# Patient Record
Sex: Female | Born: 1945 | Race: Black or African American | Hispanic: No | Smoking: Never smoker
Health system: Southern US, Community
[De-identification: ages and names within clinical notes are randomized; demographics above are authoritative.]

## PROBLEM LIST (undated history)

## (undated) DIAGNOSIS — E119 Type 2 diabetes mellitus without complications: Secondary | ICD-10-CM

## (undated) DIAGNOSIS — E785 Hyperlipidemia, unspecified: Secondary | ICD-10-CM

## (undated) DIAGNOSIS — F0390 Unspecified dementia without behavioral disturbance: Secondary | ICD-10-CM

## (undated) DIAGNOSIS — N1831 Chronic kidney disease, stage 3a: Secondary | ICD-10-CM

## (undated) DIAGNOSIS — N289 Disorder of kidney and ureter, unspecified: Secondary | ICD-10-CM

## (undated) DIAGNOSIS — I10 Essential (primary) hypertension: Secondary | ICD-10-CM

## (undated) HISTORY — PX: ABDOMINAL HYSTERECTOMY: SHX81

## (undated) HISTORY — PX: CHOLECYSTECTOMY: SHX55

---

## 2006-07-02 ENCOUNTER — Encounter: Payer: Self-pay | Admitting: Orthopedic Surgery

## 2006-07-17 ENCOUNTER — Encounter: Payer: Self-pay | Admitting: Orthopedic Surgery

## 2006-08-17 ENCOUNTER — Encounter: Payer: Self-pay | Admitting: Orthopedic Surgery

## 2013-10-24 ENCOUNTER — Ambulatory Visit: Payer: Self-pay | Admitting: Internal Medicine

## 2016-12-06 ENCOUNTER — Encounter (HOSPITAL_COMMUNITY): Payer: Self-pay

## 2016-12-06 ENCOUNTER — Emergency Department (HOSPITAL_COMMUNITY)
Admission: EM | Admit: 2016-12-06 | Discharge: 2016-12-06 | Disposition: A | Discharge status: Home / Self Care | Payer: Medicare Other | Attending: Emergency Medicine | Admitting: Emergency Medicine

## 2016-12-06 ENCOUNTER — Emergency Department (HOSPITAL_COMMUNITY): Payer: Medicare Other

## 2016-12-06 DIAGNOSIS — R42 Dizziness and giddiness: Secondary | ICD-10-CM | POA: Diagnosis not present

## 2016-12-06 DIAGNOSIS — E119 Type 2 diabetes mellitus without complications: Secondary | ICD-10-CM | POA: Insufficient documentation

## 2016-12-06 DIAGNOSIS — Z79899 Other long term (current) drug therapy: Secondary | ICD-10-CM | POA: Insufficient documentation

## 2016-12-06 DIAGNOSIS — Z7982 Long term (current) use of aspirin: Secondary | ICD-10-CM | POA: Diagnosis not present

## 2016-12-06 DIAGNOSIS — I10 Essential (primary) hypertension: Secondary | ICD-10-CM | POA: Diagnosis not present

## 2016-12-06 DIAGNOSIS — M25561 Pain in right knee: Secondary | ICD-10-CM | POA: Insufficient documentation

## 2016-12-06 DIAGNOSIS — Z7984 Long term (current) use of oral hypoglycemic drugs: Secondary | ICD-10-CM | POA: Diagnosis not present

## 2016-12-06 HISTORY — DX: Disorder of kidney and ureter, unspecified: N28.9

## 2016-12-06 HISTORY — DX: Type 2 diabetes mellitus without complications: E11.9

## 2016-12-06 HISTORY — DX: Essential (primary) hypertension: I10

## 2016-12-06 NOTE — ED Triage Notes (Signed)
Pt reports right calf pain that is relieved with rest and worse when walking. Pt denies recent travel or injury. Pain started 3-7 days ago. No history of blood clots.

## 2016-12-06 NOTE — ED Provider Notes (Signed)
[facility]-EM[facility]Y DEPT Provider Note   CSN: [id number] Arrival date & time: 12/06/16  2002  By signing my name below, I, Diona BrownerJennifer Gorman, attest that this documentation has been prepared under the direction and in the presence of Vanetta MuldersZackowski, Elfreda Blanchet, MD. Electronically Signed: Diona BrownerJennifer Gorman, ED Scribe. 12/06/16. 8:34 PM.  History   Chief Complaint Chief Complaint  Patient presents with  . Leg Pain    HPI Destiny Vazquez is a 71 y.o. female with a PMHx of DM and HTN, who presents to the Emergency Department complaining of mild right posterior knee pain that started 2-3 days ago. Pt notes pain is exacerbated when walking and alleviated when sitting or relaxing. It doesn't hurt to bend her knee, but she can feel discomfort. She also notes bilateral ankle edema and dizziness that occurred this morning, that lasted for ~ 15 minutes. 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 No recent injuries.   The history is provided by the patient. No language interpreter was used.  Leg Pain   This is a new problem. The current episode started more than 2 days ago. The problem has been gradually worsening. The pain is present in the right knee. The pain is mild. There has been no history of extremity trauma.    Past Medical History:  Diagnosis Date  . Diabetes mellitus without complication (HCC)   . Hypertension   . Renal disorder     There are no active problems to display for this patient.   Past Surgical History:  Procedure Laterality Date  . ABDOMINAL HYSTERECTOMY    . CHOLECYSTECTOMY      OB History    No data available       Home Medications    Prior to Admission medications   Medication Sig Start Date End Date Taking? Authorizing Provider  amLODipine (NORVASC) 5 MG tablet Take 5 mg by mouth daily.   Yes [provider]  aspirin EC 81 MG tablet Take 81 mg by mouth daily.   Yes [provider]  gabapentin (NEURONTIN) 300 MG capsule Take 600 mg by mouth 3  (three) times daily.   Yes [provider]  glipiZIDE (GLUCOTROL) 10 MG tablet Take 10 mg by mouth 2 (two) times daily.   Yes [provider]  lisinopril (PRINIVIL,ZESTRIL) 40 MG tablet Take 40 mg by mouth daily.   Yes [provider]  metFORMIN (GLUCOPHAGE) 1000 MG tablet Take 1,000 mg by mouth 2 (two) times daily.   Yes [provider]  omeprazole (PRILOSEC) 40 MG capsule Take 40 mg by mouth daily.   Yes [provider]  spironolactone (ALDACTONE) 25 MG tablet Take 25 mg by mouth 2 (two) times daily.   Yes [provider]  traMADol (ULTRAM) 50 MG tablet Take 50 mg by mouth every 6 (six) hours as needed for moderate pain or severe pain.   Yes [provider]  Vitamin D, Ergocalciferol, (DRISDOL) 50000 units CAPS capsule Take 50,000 Units by mouth every 30 (thirty) days.    Yes [provider]    Family History No family history on file.  Social History Social History  Substance Use Topics  . Smoking status: Never Smoker  . Smokeless tobacco: Never Used  . Alcohol use No     Allergies   Other   Review of Systems Review of Systems  Constitutional: Negative for fever.  HENT: Negative for rhinorrhea.   Eyes: Negative for visual disturbance.  Respiratory: Negative for shortness of breath.  Cardiovascular: Negative for chest pain.  Gastrointestinal: Negative for abdominal pain, diarrhea, nausea and vomiting.  Genitourinary: Negative for dysuria and hematuria.  Musculoskeletal: Positive for joint swelling. Negative for back pain.  Skin: Negative for rash.  Neurological: Positive for dizziness. Negative for headaches.  Hematological: Does not bruise/bleed easily.  Psychiatric/Behavioral: Negative for confusion.     Physical Exam Updated Vital Signs BP (!) 191/76 (BP Location: Right Arm)   Pulse 69   Temp 98.4 F (36.9 C) (Oral)   Resp 15   Ht 1.575 m (5\' 2" )   Wt 99.8 kg (220 lb)   SpO2 98%   BMI  40.24 kg/m   Physical Exam  Constitutional: She is oriented to person, place, and time. She appears well-developed and well-nourished.  HENT:  Head: Normocephalic and atraumatic.  Mouth/Throat: Oropharynx is clear and moist.  Eyes: Conjunctivae and EOM are normal. Pupils are equal, round, and reactive to light. Right eye exhibits no discharge. Left eye exhibits no discharge. No scleral icterus.  Neck: Normal range of motion.  Cardiovascular: Normal rate, regular rhythm, normal heart sounds and intact distal pulses.  Exam reveals no gallop and no friction rub.   No murmur heard. Pulmonary/Chest: Effort normal and breath sounds normal. No respiratory distress. She has no wheezes.  Abdominal: Bowel sounds are normal. She exhibits no distension. There is no tenderness.  Musculoskeletal: Normal range of motion.  Trace edema to bilateral legs, left greater than right. Discomfort behind right knee. Knee cap in proper place.  No knee effusion.  Pain to palpation posterior part of the knee. No pain with ROM.  Neurological: She is alert and oriented to person, place, and time. No cranial nerve deficit or sensory deficit. She exhibits normal muscle tone. Coordination normal.  Psychiatric: She has a normal mood and affect.  Nursing note and vitals reviewed.    ED Treatments / Results  DIAGNOSTIC STUDIES: Oxygen Saturation is 98% on RA, normal by my interpretation.   COORDINATION OF CARE: 8:34 PM-Discussed next steps with pt. Pt verbalized understanding and is agreeable with the plan.    Labs (all labs ordered are listed, but only abnormal results are displayed) No results found for this or any previous visit. Dg Knee Complete 4 Views Right  Result Date: 12/06/2016 CLINICAL DATA:  Patient states posterior right knee pain x 3-4 days. States no injury. Pain worst when bending. Her ankles are very swollen. EXAM: RIGHT KNEE - COMPLETE 4+ VIEW COMPARISON:  None. FINDINGS: No fracture of the  proximal tibia or distal femur. Spurring of the patella. No joint effusion. IMPRESSION: No acute osseous abnormality. Electronically Signed   By: Genevive Bi M.D.   On: 12/06/2016 21:28    Procedures Procedures (including critical care time)  Medications Ordered in ED Medications - No data to display  Initial Impression / Assessment and Plan / ED Course  I have reviewed the triage vital signs and the nursing notes.  Pertinent labs & imaging results that were available during my care of the patient were reviewed by me and considered in my medical decision making (see chart for details).    Patient with pain in the right calf more proximal part of the calf and behind the right knee. Ultrasound not available tonight to rule out DVT. Pain is intermittent. Patient will have ultrasound arranged tomorrow as an outpatient. X-rays of the right knee without any bony abnormalities. No history of any injury. Patient without chest pain or shortness of breath. Patient nontoxic no  acute distress. Patient's primary care doctor is [facility].   Final Clinical Impressions(s) / ED Diagnoses   Final diagnoses:  Acute pain of right knee    New Prescriptions New Prescriptions   No medications on file   I personally performed the services described in this documentation, which was scribed in my presence. The recorded information has been reviewed and is accurate.       Vanetta Mulders, MD 12/06/16 [phone]

## 2016-12-06 NOTE — Discharge Instructions (Signed)
X-ray of the right knee without any significant bony abnormalities. Return as scheduled tomorrow for Doppler studies of the right leg to rule out a blood clot in the veins. If negative and follow-up with your primary care provider in [place]. Return for any new or worse symptoms.

## 2016-12-06 NOTE — ED Notes (Signed)
Pt ambulatory to waiting room. Pt verbalized understanding of discharge instructions.   

## 2016-12-07 ENCOUNTER — Ambulatory Visit (HOSPITAL_COMMUNITY)
Admission: RE | Admit: 2016-12-07 | Discharge: 2016-12-07 | Disposition: A | Discharge status: Home / Self Care | Payer: Medicare Other | Source: Ambulatory Visit | Attending: Emergency Medicine | Admitting: Emergency Medicine

## 2016-12-07 ENCOUNTER — Other Ambulatory Visit (HOSPITAL_COMMUNITY): Payer: Self-pay | Admitting: Emergency Medicine

## 2016-12-07 DIAGNOSIS — I82401 Acute embolism and thrombosis of unspecified deep veins of right lower extremity: Secondary | ICD-10-CM

## 2016-12-07 DIAGNOSIS — M79604 Pain in right leg: Secondary | ICD-10-CM | POA: Diagnosis present

## 2016-12-07 NOTE — ED Provider Notes (Signed)
Patient return as planned for outpatient ultrasound imaging, right leg to rule out DVT.  This was done and is negative.  Patient states she is not having much pain and is able to walk now.  She has no other complaints.  She plans on following up with her PCP, if not better in 1 week.   Mancel BaleWentz, Makyah Lavigne, MD 12/07/16 [phone]

## 2019-02-20 ENCOUNTER — Other Ambulatory Visit (HOSPITAL_BASED_OUTPATIENT_CLINIC_OR_DEPARTMENT_OTHER): Payer: Self-pay

## 2019-02-20 DIAGNOSIS — G4733 Obstructive sleep apnea (adult) (pediatric): Secondary | ICD-10-CM

## 2019-03-04 ENCOUNTER — Other Ambulatory Visit: Payer: Self-pay

## 2019-03-04 ENCOUNTER — Ambulatory Visit: Payer: Medicare Other | Attending: Neurology | Admitting: Neurology

## 2019-03-04 DIAGNOSIS — Z7982 Long term (current) use of aspirin: Secondary | ICD-10-CM | POA: Diagnosis not present

## 2019-03-04 DIAGNOSIS — Z7984 Long term (current) use of oral hypoglycemic drugs: Secondary | ICD-10-CM | POA: Insufficient documentation

## 2019-03-04 DIAGNOSIS — G4733 Obstructive sleep apnea (adult) (pediatric): Secondary | ICD-10-CM | POA: Insufficient documentation

## 2019-03-04 DIAGNOSIS — Z79899 Other long term (current) drug therapy: Secondary | ICD-10-CM | POA: Diagnosis not present

## 2019-03-06 NOTE — Procedures (Signed)
[place] A. Merlene Laughter, MD     [email or web address]             NOCTURNAL POLYSOMNOGRAPHY   LOCATION: [facility]  Patient Name: Destiny Vazquez, Destiny Vazquez Date: 03/04/2019 Gender: Female D.O.B: 1945/09/20 Age (years): 60 Referring Provider: Phillips Odor MD, ABSM Height (inches): 62 Interpreting Physician: Phillips Odor MD, ABSM Weight (lbs): 220 RPSGT: Rosebud Poles BMI: 40 MRN: [id number] Neck Size: 15.50 CLINICAL INFORMATION Sleep Study Type: NPSG     Indication for sleep study: N/A     Epworth Sleepiness Score: 4     SLEEP STUDY TECHNIQUE As per the AASM Manual for the Scoring of Sleep and Associated Events v2.3 (April 2016) with a hypopnea requiring 4% desaturations.  The channels recorded and monitored were frontal, central and occipital EEG, electrooculogram (EOG), submentalis EMG (chin), nasal and oral airflow, thoracic and abdominal wall motion, anterior tibialis EMG, snore microphone, electrocardiogram, and pulse oximetry.  MEDICATIONS Medications self-administered by patient taken the night of the study : N/A  Current Outpatient Medications:  .  amLODipine (NORVASC) 5 MG tablet, Take 5 mg by mouth daily., Disp: , Rfl:  .  aspirin EC 81 MG tablet, Take 81 mg by mouth daily., Disp: , Rfl:  .  gabapentin (NEURONTIN) 300 MG capsule, Take 600 mg by mouth 3 (three) times daily., Disp: , Rfl:  .  glipiZIDE (GLUCOTROL) 10 MG tablet, Take 10 mg by mouth 2 (two) times daily., Disp: , Rfl:  .  lisinopril (PRINIVIL,ZESTRIL) 40 MG tablet, Take 40 mg by mouth daily., Disp: , Rfl:  .  metFORMIN (GLUCOPHAGE) 1000 MG tablet, Take 1,000 mg by mouth 2 (two) times daily., Disp: , Rfl:  .  omeprazole (PRILOSEC) 40 MG capsule, Take 40 mg by mouth daily., Disp: , Rfl:  .  spironolactone (ALDACTONE) 25 MG tablet, Take 25 mg by mouth 2 (two) times daily., Disp: , Rfl:  .  traMADol (ULTRAM) 50 MG tablet, Take 50 mg by mouth every 6 (six) hours as needed for moderate  pain or severe pain., Disp: , Rfl:  .  Vitamin D, Ergocalciferol, (DRISDOL) 50000 units CAPS capsule, Take 50,000 Units by mouth every 30 (thirty) days. , Disp: , Rfl:      SLEEP ARCHITECTURE The study was initiated at 10:52:56 PM and ended at 5:30:13 AM.  Sleep onset time was 1.2 minutes and the sleep efficiency was 75.4%%. The total sleep time was 299.6 minutes.  Stage REM latency was N/A minutes.  The patient spent 5.5%% of the night in stage N1 sleep, 94.3%% in stage N2 sleep, 0.2%% in stage N3 and 0% in REM.  Alpha intrusion was absent.  Supine sleep was 100.00%.  RESPIRATORY PARAMETERS The overall apnea/hypopnea index (AHI) was 33.4 per hour. There were 122 total apneas, including 122 obstructive, 0 central and 0 mixed apneas. There were 45 hypopneas and 0 RERAs.  The AHI during Stage REM sleep was N/A per hour.  AHI while supine was 33.4 per hour.  The mean oxygen saturation was 98.5%. The minimum SpO2 during sleep was 91.0%.  loud snoring was noted during this study.  CARDIAC DATA The 2 lead EKG demonstrated sinus rhythm. The mean heart rate was 66.8 beats per minute. Other EKG findings include: PVCs.  LEG MOVEMENT DATA The total PLMS were 0 with a resulting PLMS index of 0.0. Associated arousal with leg movement index was 0.0.  IMPRESSIONS 1.  Moderate obstructive sleep apnea syndrome is documented with this study.  AutoPap 8-15 is recommended.  2.  Abnormal sleep architecture is also noted with absent REM sleep and markedly reduced slow-wave sleep.  [name] A [name], MD Diplomate, American Board of Sleep Medicine.  ELECTRONICALLY SIGNED ON:  03/06/2019, 5:01 PM [facility] PH: [phone]   FX: [phone] ACCREDITED BY THE AMERICAN ACADEMY OF SLEEP MEDICINE

## 2021-03-23 ENCOUNTER — Other Ambulatory Visit: Payer: Self-pay

## 2021-03-23 ENCOUNTER — Observation Stay
Admission: EM | Admit: 2021-03-23 | Discharge: 2021-03-24 | Disposition: A | Discharge status: Home / Self Care | Payer: Medicare HMO | Attending: Internal Medicine | Admitting: Internal Medicine

## 2021-03-23 ENCOUNTER — Emergency Department: Payer: Medicare HMO

## 2021-03-23 DIAGNOSIS — I129 Hypertensive chronic kidney disease with stage 1 through stage 4 chronic kidney disease, or unspecified chronic kidney disease: Secondary | ICD-10-CM | POA: Insufficient documentation

## 2021-03-23 DIAGNOSIS — E11649 Type 2 diabetes mellitus with hypoglycemia without coma: Secondary | ICD-10-CM | POA: Insufficient documentation

## 2021-03-23 DIAGNOSIS — Z20822 Contact with and (suspected) exposure to covid-19: Secondary | ICD-10-CM | POA: Diagnosis not present

## 2021-03-23 DIAGNOSIS — N390 Urinary tract infection, site not specified: Secondary | ICD-10-CM | POA: Diagnosis not present

## 2021-03-23 DIAGNOSIS — F0390 Unspecified dementia without behavioral disturbance: Secondary | ICD-10-CM | POA: Diagnosis not present

## 2021-03-23 DIAGNOSIS — W19XXXA Unspecified fall, initial encounter: Secondary | ICD-10-CM | POA: Diagnosis not present

## 2021-03-23 DIAGNOSIS — T68XXXA Hypothermia, initial encounter: Secondary | ICD-10-CM | POA: Diagnosis not present

## 2021-03-23 DIAGNOSIS — E162 Hypoglycemia, unspecified: Secondary | ICD-10-CM | POA: Diagnosis not present

## 2021-03-23 DIAGNOSIS — N182 Chronic kidney disease, stage 2 (mild): Secondary | ICD-10-CM | POA: Diagnosis not present

## 2021-03-23 DIAGNOSIS — E1122 Type 2 diabetes mellitus with diabetic chronic kidney disease: Secondary | ICD-10-CM | POA: Diagnosis not present

## 2021-03-23 DIAGNOSIS — A419 Sepsis, unspecified organism: Principal | ICD-10-CM | POA: Insufficient documentation

## 2021-03-23 LAB — COMPREHENSIVE METABOLIC PANEL
ALT: 14 U/L (ref 0–44)
ALT: 12 U/L (ref 0–44)
AST: 20 U/L (ref 15–41)
AST: 20 U/L (ref 15–41)
Albumin: 3.6 g/dL (ref 3.5–5.0)
Albumin: 3.1 g/dL — ABNORMAL LOW (ref 3.5–5.0)
Alkaline Phosphatase: 35 U/L — ABNORMAL LOW (ref 38–126)
Alkaline Phosphatase: 31 U/L — ABNORMAL LOW (ref 38–126)
Anion gap: 9 (ref 5–15)
Anion gap: 7 (ref 5–15)
BUN: 20 mg/dL (ref 8–23)
BUN: 17 mg/dL (ref 8–23)
CO2: 24 mmol/L (ref 22–32)
CO2: 24 mmol/L (ref 22–32)
Calcium: 9.3 mg/dL (ref 8.9–10.3)
Calcium: 9 mg/dL (ref 8.9–10.3)
Chloride: 104 mmol/L (ref 98–111)
Chloride: 107 mmol/L (ref 98–111)
Creatinine, Ser: 1.24 mg/dL — ABNORMAL HIGH (ref 0.44–1.00)
Creatinine, Ser: 1.06 mg/dL — ABNORMAL HIGH (ref 0.44–1.00)
GFR, Estimated: 45 mL/min — ABNORMAL LOW (ref >60)
GFR, Estimated: 54 mL/min — ABNORMAL LOW (ref >60)
Glucose, Bld: 166 mg/dL — ABNORMAL HIGH (ref 70–99)
Glucose, Bld: 139 mg/dL — ABNORMAL HIGH (ref 70–99)
Potassium: 4.2 mmol/L (ref 3.5–5.1)
Potassium: 4.3 mmol/L (ref 3.5–5.1)
Sodium: 137 mmol/L (ref 135–145)
Sodium: 138 mmol/L (ref 135–145)
Total Bilirubin: 0.8 mg/dL (ref 0.3–1.2)
Total Bilirubin: 0.6 mg/dL (ref 0.3–1.2)
Total Protein: 6.9 g/dL (ref 6.5–8.1)
Total Protein: 5.9 g/dL — ABNORMAL LOW (ref 6.5–8.1)

## 2021-03-23 LAB — GLUCOSE, CAPILLARY
Glucose-Capillary: 110 mg/dL — ABNORMAL HIGH (ref 70–99)
Glucose-Capillary: 104 mg/dL — ABNORMAL HIGH (ref 70–99)
Glucose-Capillary: 84 mg/dL (ref 70–99)

## 2021-03-23 LAB — CBC WITH DIFFERENTIAL/PLATELET
Abs Immature Granulocytes: 0.01 10*3/uL (ref 0.00–0.07)
Abs Immature Granulocytes: 0.01 10*3/uL (ref 0.00–0.07)
Basophils Absolute: 0 10*3/uL (ref 0.0–0.1)
Basophils Absolute: 0 10*3/uL (ref 0.0–0.1)
Basophils Relative: 0 %
Basophils Relative: 0 %
Eosinophils Absolute: 0.1 10*3/uL (ref 0.0–0.5)
Eosinophils Absolute: 0.1 10*3/uL (ref 0.0–0.5)
Eosinophils Relative: 1 %
Eosinophils Relative: 1 %
HCT: 34 % — ABNORMAL LOW (ref 36.0–46.0)
HCT: 32.6 % — ABNORMAL LOW (ref 36.0–46.0)
Hemoglobin: 11.7 g/dL — ABNORMAL LOW (ref 12.0–15.0)
Hemoglobin: 10.9 g/dL — ABNORMAL LOW (ref 12.0–15.0)
Immature Granulocytes: 0 %
Immature Granulocytes: 0 %
Lymphocytes Relative: 21 %
Lymphocytes Relative: 24 %
Lymphs Abs: 1.2 10*3/uL (ref 0.7–4.0)
Lymphs Abs: 1 10*3/uL (ref 0.7–4.0)
MCH: 31.8 pg (ref 26.0–34.0)
MCH: 30.6 pg (ref 26.0–34.0)
MCHC: 34.4 g/dL (ref 30.0–36.0)
MCHC: 33.4 g/dL (ref 30.0–36.0)
MCV: 92.4 fL (ref 80.0–100.0)
MCV: 91.6 fL (ref 80.0–100.0)
Monocytes Absolute: 0.3 10*3/uL (ref 0.1–1.0)
Monocytes Absolute: 0.3 10*3/uL (ref 0.1–1.0)
Monocytes Relative: 6 %
Monocytes Relative: 6 %
Neutro Abs: 3.9 10*3/uL (ref 1.7–7.7)
Neutro Abs: 3 10*3/uL (ref 1.7–7.7)
Neutrophils Relative %: 72 %
Neutrophils Relative %: 69 %
Platelets: 211 10*3/uL (ref 150–400)
Platelets: 200 10*3/uL (ref 150–400)
RBC: 3.68 MIL/uL — ABNORMAL LOW (ref 3.87–5.11)
RBC: 3.56 MIL/uL — ABNORMAL LOW (ref 3.87–5.11)
RDW: 13.6 % (ref 11.5–15.5)
RDW: 13.7 % (ref 11.5–15.5)
WBC: 5.5 10*3/uL (ref 4.0–10.5)
WBC: 4.3 10*3/uL (ref 4.0–10.5)
nRBC: 0 % (ref 0.0–0.2)
nRBC: 0 % (ref 0.0–0.2)

## 2021-03-23 LAB — URINALYSIS, COMPLETE (UACMP) WITH MICROSCOPIC
Bacteria, UA: NONE SEEN
Bilirubin Urine: NEGATIVE
Glucose, UA: NEGATIVE mg/dL
Hgb urine dipstick: NEGATIVE
Ketones, ur: NEGATIVE mg/dL
Leukocytes,Ua: NEGATIVE
Nitrite: NEGATIVE
Protein, ur: NEGATIVE mg/dL
RBC / HPF: NONE SEEN RBC/hpf (ref 0–5)
Specific Gravity, Urine: 1.015 (ref 1.005–1.030)
WBC, UA: NONE SEEN WBC/hpf (ref 0–5)
pH: 5.5 (ref 5.0–8.0)

## 2021-03-23 LAB — TROPONIN I (HIGH SENSITIVITY)
Troponin I (High Sensitivity): <2 ng/L (ref <18)
Troponin I (High Sensitivity): 3 ng/L (ref <18)

## 2021-03-23 LAB — CBG MONITORING, ED
Glucose-Capillary: 121 mg/dL — ABNORMAL HIGH (ref 70–99)
Glucose-Capillary: 164 mg/dL — ABNORMAL HIGH (ref 70–99)

## 2021-03-23 LAB — RESP PANEL BY RT-PCR (FLU A&B, COVID) ARPGX2
Influenza A by PCR: NEGATIVE
Influenza B by PCR: NEGATIVE
SARS Coronavirus 2 by RT PCR: NEGATIVE

## 2021-03-23 LAB — PROTIME-INR
INR: 1 (ref 0.8–1.2)
Prothrombin Time: 13 seconds (ref 11.4–15.2)

## 2021-03-23 LAB — LACTIC ACID, PLASMA
Lactic Acid, Venous: 3.3 mmol/L (ref 0.5–1.9)
Lactic Acid, Venous: 3.5 mmol/L (ref 0.5–1.9)
Lactic Acid, Venous: 3 mmol/L (ref 0.5–1.9)
Lactic Acid, Venous: 1.9 mmol/L (ref 0.5–1.9)

## 2021-03-23 LAB — HEMOGLOBIN A1C
Hgb A1c MFr Bld: 5.1 % (ref 4.8–5.6)
Mean Plasma Glucose: 99.67 mg/dL

## 2021-03-23 LAB — CORTISOL: Cortisol, Plasma: 6.4 ug/dL

## 2021-03-23 LAB — PROCALCITONIN: Procalcitonin: <0.1 ng/mL

## 2021-03-23 LAB — APTT: aPTT: 27 seconds (ref 24–36)

## 2021-03-23 MED ORDER — AMLODIPINE BESYLATE 5 MG PO TABS
10.0000 mg | ORAL_TABLET | Freq: Every day | ORAL | Status: DC
  Administered 2021-03-23 – 2021-03-24 (×2): 10 mg via ORAL
  Filled 2021-03-23 (×2): qty 2

## 2021-03-23 MED ORDER — ACETAMINOPHEN 325 MG PO TABS: 650.0000 mg | ORAL_TABLET | Freq: Four times a day (QID) | ORAL | Status: DC | PRN

## 2021-03-23 MED ORDER — LACTATED RINGERS IV BOLUS (SEPSIS)
1000.0000 mL | Freq: Once | INTRAVENOUS | Status: AC
  Administered 2021-03-23: 1000 mL via INTRAVENOUS

## 2021-03-23 MED ORDER — ACETAMINOPHEN 650 MG RE SUPP: 650.0000 mg | Freq: Four times a day (QID) | RECTAL | Status: DC | PRN

## 2021-03-23 MED ORDER — PANTOPRAZOLE SODIUM 40 MG PO TBEC
40.0000 mg | DELAYED_RELEASE_TABLET | Freq: Every day | ORAL | Status: DC
  Administered 2021-03-24: 40 mg via ORAL
  Filled 2021-03-23: qty 1

## 2021-03-23 MED ORDER — SODIUM CHLORIDE 0.9 % IV SOLN: INTRAVENOUS | Status: DC

## 2021-03-23 MED ORDER — MAGNESIUM HYDROXIDE 400 MG/5ML PO SUSP: 30.0000 mL | Freq: Every day | ORAL | Status: DC | PRN

## 2021-03-23 MED ORDER — LISINOPRIL 20 MG PO TABS
40.0000 mg | ORAL_TABLET | Freq: Every day | ORAL | Status: DC
  Administered 2021-03-23 – 2021-03-24 (×2): 40 mg via ORAL
  Filled 2021-03-23 (×2): qty 2

## 2021-03-23 MED ORDER — LACTATED RINGERS IV SOLN: INTRAVENOUS | Status: DC

## 2021-03-23 MED ORDER — SPIRONOLACTONE 25 MG PO TABS
25.0000 mg | ORAL_TABLET | Freq: Two times a day (BID) | ORAL | Status: DC
  Administered 2021-03-23 – 2021-03-24 (×2): 25 mg via ORAL
  Filled 2021-03-23 (×2): qty 1

## 2021-03-23 MED ORDER — ASPIRIN EC 81 MG PO TBEC
81.0000 mg | DELAYED_RELEASE_TABLET | Freq: Every day | ORAL | Status: DC
  Administered 2021-03-24: 81 mg via ORAL
  Filled 2021-03-23: qty 1

## 2021-03-23 MED ORDER — ONDANSETRON HCL 4 MG/2ML IJ SOLN: 4.0000 mg | Freq: Four times a day (QID) | INTRAMUSCULAR | Status: DC | PRN

## 2021-03-23 MED ORDER — TRAMADOL HCL 50 MG PO TABS
50.0000 mg | ORAL_TABLET | Freq: Four times a day (QID) | ORAL | Status: DC | PRN
Start: 2021-03-23 — End: 2021-03-24

## 2021-03-23 MED ORDER — VITAMIN D (ERGOCALCIFEROL) 1.25 MG (50000 UNIT) PO CAPS: 50000.0000 [IU] | ORAL_CAPSULE | ORAL | Status: DC

## 2021-03-23 MED ORDER — ENOXAPARIN SODIUM 60 MG/0.6ML IJ SOSY
0.5000 mg/kg | PREFILLED_SYRINGE | INTRAMUSCULAR | Status: DC
  Administered 2021-03-23 – 2021-03-24 (×2): 50 mg via SUBCUTANEOUS
  Filled 2021-03-23 (×2): qty 0.6

## 2021-03-23 MED ORDER — VANCOMYCIN HCL IN DEXTROSE 1-5 GM/200ML-% IV SOLN: 1000.0000 mg | Freq: Once | INTRAVENOUS | Status: DC

## 2021-03-23 MED ORDER — VANCOMYCIN HCL 2000 MG/400ML IV SOLN
2000.0000 mg | Freq: Once | INTRAVENOUS | Status: AC
  Administered 2021-03-23: 2000 mg via INTRAVENOUS
  Filled 2021-03-23: qty 400

## 2021-03-23 MED ORDER — SODIUM CHLORIDE 0.9 % IV SOLN
2.0000 g | INTRAVENOUS | Status: DC
Start: 2021-03-23 — End: 2021-03-24
  Administered 2021-03-23: 2 g via INTRAVENOUS
  Filled 2021-03-23 (×2): qty 20

## 2021-03-23 MED ORDER — SODIUM CHLORIDE 0.9 % IV SOLN
2.0000 g | Freq: Once | INTRAVENOUS | Status: AC
  Administered 2021-03-23: 2 g via INTRAVENOUS
  Filled 2021-03-23: qty 2

## 2021-03-23 MED ORDER — ONDANSETRON HCL 4 MG PO TABS: 4.0000 mg | ORAL_TABLET | Freq: Four times a day (QID) | ORAL | Status: DC | PRN

## 2021-03-23 MED ORDER — TRAZODONE HCL 50 MG PO TABS: 25.0000 mg | ORAL_TABLET | Freq: Every evening | ORAL | Status: DC | PRN

## 2021-03-23 MED ORDER — METRONIDAZOLE 500 MG/100ML IV SOLN
500.0000 mg | Freq: Once | INTRAVENOUS | Status: AC
  Administered 2021-03-23: 500 mg via INTRAVENOUS
  Filled 2021-03-23: qty 100

## 2021-03-23 MED ORDER — LACTATED RINGERS IV BOLUS (SEPSIS)
500.0000 mL | Freq: Once | INTRAVENOUS | Status: AC
  Administered 2021-03-23: 500 mL via INTRAVENOUS

## 2021-03-23 MED ORDER — GABAPENTIN 300 MG PO CAPS
600.0000 mg | ORAL_CAPSULE | Freq: Three times a day (TID) | ORAL | Status: DC
  Administered 2021-03-23 – 2021-03-24 (×3): 600 mg via ORAL
  Filled 2021-03-23 (×3): qty 2

## 2021-03-23 NOTE — ED Notes (Signed)
Pt eating breakfast, daughter at bedside to assist pt

## 2021-03-23 NOTE — ED Notes (Signed)
Pt was in and out cathed; pt did not produce any urine at this time; purewick in place.

## 2021-03-23 NOTE — Progress Notes (Signed)
PHARMACIST - PHYSICIAN COMMUNICATION  CONCERNING:  Enoxaparin (Lovenox) for DVT Prophylaxis    RECOMMENDATION: Patient was prescribed enoxaprin 40mg  q24 hours for VTE prophylaxis.   Filed Weights   03/23/21 0151  Weight: 100 kg (220 lb 7.4 oz)    Body mass index is 40.32 kg/m.  Estimated Creatinine Clearance: 42 mL/min (A) (by C-G formula based on SCr of 1.24 mg/dL (H)).   Based on [facility] policy patient is candidate for enoxaparin 0.5mg /kg TBW SQ every 24 hours based on BMI being >30.  DESCRIPTION: Pharmacy has adjusted enoxaparin dose per [facility] policy.  Patient is now receiving enoxaparin 0.5 mg/kg every 24 hours   [facility], PharmD, [facility] 03/23/2021 5:12 AM

## 2021-03-23 NOTE — ED Notes (Signed)
Admitting dr at bedside with daughter as well

## 2021-03-23 NOTE — ED Triage Notes (Signed)
[facility]; pt fell in house; cbg of 28 ems gave d10 100mg ; IV estab; VSS; NSR; 120/74; hr 60; 100% on RA and cbg now 174;

## 2021-03-23 NOTE — ED Notes (Signed)
Per RN, pt okay to go to inpatient room.

## 2021-03-23 NOTE — Care Management CC44 (Signed)
Condition Code 44 Documentation Completed  Patient Details  Name: Destiny Vazquez MRN: [id number] Date of Birth: 02/19/1946   Condition Code 44 given:  Yes Patient signature on Condition Code 44 notice:  Yes Documentation of 2 MD's agreement:  Yes Code 44 added to claim:  Yes    Vanderbilt Cellar, RN 03/23/2021, 5:49 PM

## 2021-03-23 NOTE — ED Provider Notes (Signed)
[facility] Emergency Department Provider Note   ____________________________________________   Event Date/Time   First MD Initiated Contact with Patient 03/23/21 0151     (approximate)  I have reviewed the triage vital signs and the nursing notes.   HISTORY  Chief Complaint Hypoglycemia  Level V caveat: Limited by dementia  HPI Destiny Vazquez is a 75 y.o. female brought to the ED via EMS from home with a chief complaint of hypoglycemia.  EMS was called out for patient sliding out of her chair onto the floor.  Denies striking head or LOC.  FSBS on EMS arrival was 28.  She was given a peanut butter sandwich and 100 cc D10 with arrival FSBS 121.  Patient voices no complaints.  Denies chest pain, shortness of breath, abdominal pain, nausea, vomiting or dizziness.     Past Medical History:  Diagnosis Date   Diabetes mellitus without complication (HCC)    Hypertension    Renal disorder     Patient Active Problem List   Diagnosis Date Noted   Hypoglycemia 03/23/2021     Past Surgical History:  Procedure Laterality Date   ABDOMINAL HYSTERECTOMY     CHOLECYSTECTOMY      Prior to Admission medications   Medication Sig Start Date End Date Taking? Authorizing Provider  amLODipine (NORVASC) 5 MG tablet Take 5 mg by mouth daily.    [provider]  aspirin EC 81 MG tablet Take 81 mg by mouth daily.    [provider]  gabapentin (NEURONTIN) 300 MG capsule Take 600 mg by mouth 3 (three) times daily.    [provider]  glipiZIDE (GLUCOTROL) 10 MG tablet Take 10 mg by mouth 2 (two) times daily.    [provider]  lisinopril (PRINIVIL,ZESTRIL) 40 MG tablet Take 40 mg by mouth daily.    [provider]  metFORMIN (GLUCOPHAGE) 1000 MG tablet Take 1,000 mg by mouth 2 (two) times daily.    [provider]  omeprazole (PRILOSEC) 40 MG capsule Take 40 mg by mouth daily.    [provider]   spironolactone (ALDACTONE) 25 MG tablet Take 25 mg by mouth 2 (two) times daily.    [provider]  traMADol (ULTRAM) 50 MG tablet Take 50 mg by mouth every 6 (six) hours as needed for moderate pain or severe pain.    [provider]  Vitamin D, Ergocalciferol, (DRISDOL) 50000 units CAPS capsule Take 50,000 Units by mouth every 30 (thirty) days.     [provider]    Allergies Other  History reviewed. No pertinent family history.  Social History Social History   Tobacco Use   Smoking status: Never   Smokeless tobacco: Never  Substance Use Topics   Alcohol use: No   Drug use: No    Review of Systems  Constitutional: Positive for hypoglycemia.  No fever/chills Eyes: No visual changes. ENT: No sore throat. Cardiovascular: Denies chest pain. Respiratory: Denies shortness of breath. Gastrointestinal: No abdominal pain.  No nausea, no vomiting.  No diarrhea.  No constipation. Genitourinary: Negative for dysuria. Musculoskeletal: Negative for back pain. Skin: Negative for rash. Neurological: Negative for headaches, focal weakness or numbness.   ____________________________________________   PHYSICAL EXAM:  VITAL SIGNS: ED Triage Vitals  Enc Vitals Group     BP 03/23/21 0149 109/78     Pulse Rate 03/23/21 0149 73     Resp 03/23/21 0149 20     Temp 03/23/21 0149 (!) 96.5 F (  35.8 C)     Temp Source 03/23/21 0149 Rectal     SpO2 03/23/21 0149 100 %     Weight 03/23/21 0151 220 lb 7.4 oz (100 kg)     Height 03/23/21 0151 5\' 2"  (1.575 m)     Head Circumference --      Peak Flow --      Pain Score 03/23/21 0151 0     Pain Loc --      Pain Edu? --      Excl. in GC? --     Constitutional: Alert and oriented.  Elderly appearing and in no acute distress. Eyes: Conjunctivae are normal. PERRL. EOMI. Head: Atraumatic. Nose: Atraumatic. Mouth/Throat: Mucous membranes are mildly dry. Neck: No stridor.  No cervical spine tenderness to  palpation. Cardiovascular: Normal rate, regular rhythm. Grossly normal heart sounds.  Good peripheral circulation. Respiratory: Normal respiratory effort.  No retractions. Lungs CTAB. Gastrointestinal: Soft and nontender to light or deep palpation. No distention. No abdominal bruits. No CVA tenderness. Musculoskeletal: No lower extremity tenderness nor edema.  No joint effusions. Neurologic: Alert and oriented to person.  Normal speech and language. No gross focal neurologic deficits are appreciated.  Skin:  Skin is warm, dry and intact. No rash noted. Psychiatric: Mood and affect are normal. Speech and behavior are normal.  ____________________________________________   LABS (all labs ordered are listed, but only abnormal results are displayed)  Labs Reviewed  CBC WITH DIFFERENTIAL/PLATELET - Abnormal; Notable for the following components:      Result Value   RBC 3.68 (*)    Hemoglobin 11.7 (*)    HCT 34.0 (*)    All other components within normal limits  COMPREHENSIVE METABOLIC PANEL - Abnormal; Notable for the following components:   Glucose, Bld 166 (*)    Creatinine, Ser 1.24 (*)    Alkaline Phosphatase 35 (*)    GFR, Estimated 45 (*)    All other components within normal limits  LACTIC ACID, PLASMA - Abnormal; Notable for the following components:   Lactic Acid, Venous 3.3 (*)    All other components within normal limits  LACTIC ACID, PLASMA - Abnormal; Notable for the following components:   Lactic Acid, Venous 3.5 (*)    All other components within normal limits  CBC WITH DIFFERENTIAL/PLATELET - Abnormal; Notable for the following components:   RBC 3.56 (*)    Hemoglobin 10.9 (*)    HCT 32.6 (*)    All other components within normal limits  CBG MONITORING, ED - Abnormal; Notable for the following components:   Glucose-Capillary 121 (*)    All other components within normal limits  CBG MONITORING, ED - Abnormal; Notable for the following components:    Glucose-Capillary 164 (*)    All other components within normal limits  RESP PANEL BY RT-PCR (FLU A&B, COVID) ARPGX2  URINE CULTURE  CULTURE, BLOOD (ROUTINE X 2)  CULTURE, BLOOD (ROUTINE X 2)  URINALYSIS, COMPLETE (UACMP) WITH MICROSCOPIC  PROCALCITONIN  PROTIME-INR  APTT  COMPREHENSIVE METABOLIC PANEL  LACTIC ACID, PLASMA  LACTIC ACID, PLASMA  CORTISOL  TROPONIN I (HIGH SENSITIVITY)  TROPONIN I (HIGH SENSITIVITY)   ____________________________________________  EKG  ED ECG REPORT I, Ethaniel Garfield J, the attending physician, personally viewed and interpreted this ECG.   Date: 03/23/2021  EKG Time: 0141  Rate: 68  Rhythm: normal sinus rhythm  Axis: Normal  Intervals:none  ST&T Change: Nonspecific  ____________________________________________  RADIOLOGY I, Celester Lech J, personally viewed and evaluated these images (plain radiographs)  as part of my medical decision making, as well as reviewing the written report by the radiologist.  ED MD interpretation: No acute cardiopulmonary process  Official radiology report(s): DG Chest Port 1 View  Result Date: 03/23/2021 CLINICAL DATA:  Hypoglycemia EXAM: PORTABLE CHEST 1 VIEW COMPARISON:  None. FINDINGS: The heart size and mediastinal contours are within normal limits. Both lungs are clear. The visualized skeletal structures are unremarkable. IMPRESSION: No active disease. Electronically Signed   By: Charlett Nose M.D.   On: 03/23/2021 02:14    ____________________________________________   PROCEDURES  Procedure(s) performed (including Critical Care):  .1-3 Lead EKG Interpretation  Date/Time: 03/23/2021 2:21 AM Performed by: Irean Hong, MD Authorized by: Irean Hong, MD     Interpretation: normal     ECG rate:  73   ECG rate assessment: normal     Rhythm: sinus rhythm     Ectopy: none     Conduction: normal   Comments:     Patient placed on cardiac monitor to evaluate for arrhythmias  CRITICAL CARE Performed by:  Irean Hong   Total critical care time: 30 minutes  Critical care time was exclusive of separately billable procedures and treating other patients.  Critical care was necessary to treat or prevent imminent or life-threatening deterioration.  Critical care was time spent personally by me on the following activities: development of treatment plan with patient and/or surrogate as well as nursing, discussions with consultants, evaluation of patient's response to treatment, examination of patient, obtaining history from patient or surrogate, ordering and performing treatments and interventions, ordering and review of laboratory studies, ordering and review of radiographic studies, pulse oximetry and re-evaluation of patient's condition.    ____________________________________________   INITIAL IMPRESSION / ASSESSMENT AND PLAN / ED COURSE  As part of my medical decision making, I reviewed the following data within the electronic MEDICAL RECORD NUMBER History obtained from family, Nursing notes reviewed and incorporated, Labs reviewed, EKG interpreted, Old chart reviewed, Radiograph reviewed, Discussed with admitting physician, and Notes from prior ED visits     75 year old female presenting status post slide and fall with hypoglycemia.  Differential diagnosis includes but is not limited to sepsis, community-acquired pneumonia, UTI, ACS, metabolic derangement, etc.  Will obtain sepsis panel, chest x-ray, in/out cath for UA.  Apply Bair hugger to warm patient.  Closely monitor blood sugars.  Clinical Course as of 03/23/21 0551  Wed Mar 23, 2021  0222 Elevated lactic noted.  Will initiate sepsis protocol fluids and broad-spectrum IV antibiotics.  We will closely monitor blood sugar in light of fluid bolus. [JS]  0422 Core temperature remains hypothermic; will continue Bair hugger.  UA unremarkable.  Remains hemodynamically stable.  Will discuss with hospitalist services for admission. [JS]     Clinical Course User Index [JS] Irean Hong, MD     ____________________________________________   FINAL CLINICAL IMPRESSION(S) / ED DIAGNOSES  Final diagnoses:  Fall, initial encounter  Hypoglycemia  Hypothermia, initial encounter  Sepsis, due to unspecified organism, unspecified whether acute organ dysfunction present [facility])     ED Discharge Orders     None        Note:  This document was prepared using Dragon voice recognition software and may include unintentional dictation errors.    Irean Hong, MD 03/23/21 [phone]

## 2021-03-23 NOTE — H&P (Signed)
[place]   PATIENT NAME: Destiny Vazquez    MR#:  [id number]  DATE OF BIRTH:  1945/07/29  DATE OF ADMISSION:  03/23/2021  PRIMARY CARE PHYSICIAN: [facility]   Patient is coming from: Home  REQUESTING/REFERRING PHYSICIAN: Chiquita Loth, MD  CHIEF COMPLAINT:   Chief Complaint  Patient presents with   Hypoglycemia    HISTORY OF PRESENT ILLNESS:  Twanda Stakes is a 75 y.o. female with medical history significant for type 2 diabetes mellitus hypertension, GERD, dementia and stage II chronic kidney disease, presented to the ER with acute onset of hypoglycemia.  The patient apparently slid out of her chair onto the floor.  EMS with no head injury, presyncope or syncope.  She had no reported nausea or vomiting or diarrhea.  No fever or chills.  No chest pain or dyspnea or cough or palpitations.  She admits to urinary frequency and urgency without dysuria or hematuria or flank pain.  History slightly limited due to her underlying dementia.  Patient was noted to have blood glucose of 26 at home.  She was given peanut butter sandwich and her blood glucose was up to 60.  She was also given D10W by EMS and his blood glucose was up to 121 in the ER.  ED Course: When she came to the ER here she was hypothermic with a temperature of 96.5/35.8 Celsius, with respiratory rate of 20 and with otherwise normal vital signs.  Labs revealed creatinine 1.24 with otherwise unremarkable CMP.  High-sensitivity troponin I was 3.  Lactic acid was 3.3 and later 3.5 CBC showed mild anemia EKG as reviewed by me : Showed normal sinus rhythm with a rate of 68 with low voltage QRS and J-point elevation inferiorly with mild ST segment depression as well as anterolaterally. Imaging: Portable chest x-ray showed no acute cardiopulmonary disease.  The patient was also given IV cefepime, vancomycin and Flagyl as well as 30 mill per kilogram of IV lactated ringer.  She was also given she will be  admitted to a medical monitored bed for further evaluation and management. PAST MEDICAL HISTORY:   Past Medical History:  Diagnosis Date   Diabetes mellitus without complication (HCC)    Hypertension    Renal disorder   Dementia.  PAST SURGICAL HISTORY:   Past Surgical History:  Procedure Laterality Date   ABDOMINAL HYSTERECTOMY     CHOLECYSTECTOMY      SOCIAL HISTORY:   Social History   Tobacco Use   Smoking status: Never   Smokeless tobacco: Never  Substance Use Topics   Alcohol use: No    FAMILY HISTORY:   Positive for breast cancer in her daughter and hypothyroidism in her grandson.  DRUG ALLERGIES:   Allergies  Allergen Reactions   Other     Pt states allergic to two medications but does not know name.    REVIEW OF SYSTEMS:   ROS As per history of present illness. All pertinent systems were reviewed above. Constitutional, HEENT, cardiovascular, respiratory, GI, GU, musculoskeletal, neuro, psychiatric, endocrine, integumentary and hematologic systems were reviewed and are otherwise negative/unremarkable except for positive findings mentioned above in the HPI.   MEDICATIONS AT HOME:   Prior to Admission medications   Medication Sig Start Date End Date Taking? Authorizing Provider  amLODipine (NORVASC) 5 MG tablet Take 5 mg by mouth daily.    [provider]  aspirin EC 81 MG tablet Take 81 mg by mouth daily.  [provider]  gabapentin (NEURONTIN) 300 MG capsule Take 600 mg by mouth 3 (three) times daily.    [provider]  glipiZIDE (GLUCOTROL) 10 MG tablet Take 10 mg by mouth 2 (two) times daily.    [provider]  lisinopril (PRINIVIL,ZESTRIL) 40 MG tablet Take 40 mg by mouth daily.    [provider]  metFORMIN (GLUCOPHAGE) 1000 MG tablet Take 1,000 mg by mouth 2 (two) times daily.    [provider]  omeprazole (PRILOSEC) 40 MG capsule Take 40 mg by mouth daily.    [provider]   spironolactone (ALDACTONE) 25 MG tablet Take 25 mg by mouth 2 (two) times daily.    [provider]  traMADol (ULTRAM) 50 MG tablet Take 50 mg by mouth every 6 (six) hours as needed for moderate pain or severe pain.    [provider]  Vitamin D, Ergocalciferol, (DRISDOL) 50000 units CAPS capsule Take 50,000 Units by mouth every 30 (thirty) days.     [provider]      VITAL SIGNS:  Blood pressure 118/73, pulse 71, temperature 98.1 F (36.7 C), temperature source Oral, resp. rate 16, height 5\' 2"  (1.575 m), weight 100 kg, SpO2 100 %.  PHYSICAL EXAMINATION:  Physical Exam  GENERAL:  75 y.o.-year-old African-American female patient lying in the bed with no acute distress.  EYES: Pupils equal, round, reactive to light and accommodation. No scleral icterus. Extraocular muscles intact.  HEENT: Head atraumatic, normocephalic. Oropharynx and nasopharynx clear.  NECK:  Supple, no jugular venous distention. No thyroid enlargement, no tenderness.  LUNGS: Normal breath sounds bilaterally, no wheezing, rales,rhonchi or crepitation. No use of accessory muscles of respiration.  CARDIOVASCULAR: Regular rate and rhythm, S1, S2 normal. No murmurs, rubs, or gallops.  ABDOMEN: Soft, nondistended, nontender. Bowel sounds present. No organomegaly or mass.  EXTREMITIES: No pedal edema, cyanosis, or clubbing.  NEUROLOGIC: Cranial nerves II through XII are intact. Muscle strength 5/5 in all extremities. Sensation intact. Gait not checked.  PSYCHIATRIC: The patient is alert and oriented x 3.  Normal affect and good eye contact. SKIN: No obvious rash, lesion, or ulcer.   LABORATORY PANEL:   CBC Recent Labs  Lab 03/23/21 0143  WBC 5.5  HGB 11.7*  HCT 34.0*  PLT 211   ------------------------------------------------------------------------------------------------------------------  Chemistries  Recent Labs  Lab 03/23/21 0143  NA 137  K 4.2  CL 104  CO2 24  GLUCOSE  166*  BUN 20  CREATININE 1.24*  CALCIUM 9.3  AST 20  ALT 14  ALKPHOS 35*  BILITOT 0.8   ------------------------------------------------------------------------------------------------------------------  Cardiac Enzymes No results for input(s): TROPONINI in the last 168 hours. ------------------------------------------------------------------------------------------------------------------  RADIOLOGY:  DG Chest Port 1 View  Result Date: 03/23/2021 CLINICAL DATA:  Hypoglycemia EXAM: PORTABLE CHEST 1 VIEW COMPARISON:  None. FINDINGS: The heart size and mediastinal contours are within normal limits. Both lungs are clear. The visualized skeletal structures are unremarkable. IMPRESSION: No active disease. Electronically Signed   By: 05/23/2021 M.D.   On: 03/23/2021 02:14      IMPRESSION AND PLAN:  Active Problems:   Hypoglycemia  1.  Symptomatic hypoglycemia with type 2 diabetes mellitus with subsequent mechanical fall without injuries.  She has mild acute kidney injury. - The patient will be admitted to a medical monitored bed. - We will hold off oral antidiabetics including glipizide.  We will also hold metformin. - She will be placed on supplement coverage with NovoLog. - Her creatinine has  improved after hydration with IV lactated Ringer.  2.  UTI with elevated lactic acid, concerning about pending sepsis and severe sepsis. - The patient was given aggressive hydration with IV lactated Ringer and placed on IV Rocephin. - We will follow blood and urine cultures. - She only has hypothermia but her respiratory rate was only 20 at this time.  We will continue monitoring her vital signs.  3.  Peripheral neuropathy. - We will continue Neurontin.  4.  Essential hypertension. - We will continue her lisinopril, amlodipine well as her creatinine has already corrected with hydration. - We will continue Aldactone as well.  5.  Dementia. - We will continue Aricept.  6.   Dyslipidemia. - We will continue statin therapy.  7.  GERD. - We will continue PPI therapy.  8.  Vitamin B12 and vitamin D deficiency. - We will continue both vitamin D and vitamin B12.   DVT prophylaxis: Lovenox. Code Status: full code. Family Communication:  The plan of care was discussed in details with the patient (and daughter who was with her in the room). I answered all questions. The patient agreed to proceed with the above mentioned plan. Further management will depend upon hospital course. Disposition Plan: Back to previous home environment Consults called: none. All the records are reviewed and case discussed with ED provider.  Status is: Inpatient  Remains inpatient appropriate because:Ongoing diagnostic testing needed not appropriate for outpatient work up, Unsafe d/c plan, IV treatments appropriate due to intensity of illness or inability to take PO, and Inpatient level of care appropriate due to severity of illness  Dispo: The patient is from: Home              Anticipated d/c is to: Home              Patient currently is not medically stable to d/c.   Difficult to place patient No  TOTAL TIME TAKING CARE OF THIS PATIENT: 55 minutes.    Hannah Beat M.D on 03/23/2021 at 5:11 AM  [facility]   From 7 PM-7 AM, contact night-coverage [email or web address]  CC: Primary care physician; [facility]

## 2021-03-23 NOTE — ED Notes (Signed)
Pt vancomycin placed on pump. Temp rechecked, 98.6, bair hugger turned off, will notify MD. Call bell in reach. Denies needs

## 2021-03-23 NOTE — ED Notes (Signed)
Secure msg sent to Jeannie Done, RN for report.

## 2021-03-23 NOTE — Progress Notes (Signed)
Brief same day day note:  Patient is a 75 year old female with history of diabetes type 2, hypertension, GERD, dementia, stage II CKD who presented to the emergency department with complaints of low blood sugar.  She lives with her daughter. She is ambulatory without any problem. At home she was noted to have blood glucose of 26.  When EMS arrived, she was given on her level of D10 which elevated her glucose to 121.  On presentation she was hypothermic otherwise hemodynamically stable.  Lactate she was elevated.  EKG showed J-point elevation .  Patient was also given a dose of broad-spectrum antibiotics: Cefepime ,vancomycin, Flagyl for suspected sepsis due to hypothermia ,started on IV fluids. Patient seen and examined the bedside this morning in the emergency department.  During my evaluation, she was hemodynamically stable.  She was alert, awake, communicated well and knew that she is in the hospital.  Daughter at bedside.  Heart hypothermia is already resolved with bear hugger.  Hypoglycemia has resolved.  She takes metformin and glipizide at home.  Currently on sliding scale insulin. She was complaining of increased frequency of urination but denies dysuria.  She has been started on ceftriaxone for presumed UTI: She also had elevated lactic acid level.  Urine culture will be followed.  Lactic acidosis has resolved Will check hemoglobin A1c level.

## 2021-03-23 NOTE — Progress Notes (Signed)
PHARMACY -  BRIEF ANTIBIOTIC NOTE   Pharmacy has received consult(s) for Cefepime and Vancomycin from an ED provider.  The patient's profile has been reviewed for ht/wt/allergies/indication/available labs.    One time order(s) placed for Cefepime 2 gm and Vancomycin 2 gm per pt wt: 100 kg  Further antibiotics/pharmacy consults should be ordered by admitting physician if indicated.                       Thank you, Otelia Sergeant, PharmD, [facility] 03/23/2021 2:30 AM

## 2021-03-23 NOTE — Care Management Obs Status (Addendum)
MEDICARE OBSERVATION STATUS NOTIFICATION   Patient Details  Name: Destiny Vazquez MRN: [id number] Date of Birth: 1945/11/10   Medicare Observation Status Notification Given:  Yes, verbally given to POA, daughter-Ivory.     Minong Cellar, RN 03/23/2021, 5:49 PM

## 2021-03-23 NOTE — ED Notes (Signed)
Pts sheets changed; pure wick removed.

## 2021-03-24 DIAGNOSIS — E162 Hypoglycemia, unspecified: Secondary | ICD-10-CM | POA: Diagnosis not present

## 2021-03-24 LAB — URINE CULTURE: Culture: NO GROWTH

## 2021-03-24 LAB — BASIC METABOLIC PANEL
Anion gap: 5 (ref 5–15)
BUN: 10 mg/dL (ref 8–23)
CO2: 26 mmol/L (ref 22–32)
Calcium: 9 mg/dL (ref 8.9–10.3)
Chloride: 111 mmol/L (ref 98–111)
Creatinine, Ser: 0.94 mg/dL (ref 0.44–1.00)
GFR, Estimated: >60 mL/min (ref >60)
Glucose, Bld: 84 mg/dL (ref 70–99)
Potassium: 3.9 mmol/L (ref 3.5–5.1)
Sodium: 142 mmol/L (ref 135–145)

## 2021-03-24 LAB — CBC
HCT: 30.2 % — ABNORMAL LOW (ref 36.0–46.0)
Hemoglobin: 10.5 g/dL — ABNORMAL LOW (ref 12.0–15.0)
MCH: 31.7 pg (ref 26.0–34.0)
MCHC: 34.8 g/dL (ref 30.0–36.0)
MCV: 91.2 fL (ref 80.0–100.0)
Platelets: 196 10*3/uL (ref 150–400)
RBC: 3.31 MIL/uL — ABNORMAL LOW (ref 3.87–5.11)
RDW: 13.7 % (ref 11.5–15.5)
WBC: 4.5 10*3/uL (ref 4.0–10.5)
nRBC: 0 % (ref 0.0–0.2)

## 2021-03-24 LAB — GLUCOSE, CAPILLARY
Glucose-Capillary: 82 mg/dL (ref 70–99)
Glucose-Capillary: 84 mg/dL (ref 70–99)

## 2021-03-24 MED ORDER — BLOOD GLUCOSE MONITOR KIT: PACK | 0 refills | Status: AC

## 2021-03-24 NOTE — Progress Notes (Signed)
Pt discharged home with daughter. Transported in [employer]. Discharge instructions given to daughter [place] who verbalizes understanding. All personal belonging taken with patient.

## 2021-03-24 NOTE — Discharge Summary (Signed)
Physician Discharge Summary  Destiny Vazquez EXB:[id number] DOB: [birth year] DOA: 03/23/2021  PCP: [facility] date: 03/23/2021 Discharge date: 03/24/2021  Admitted From: Home Disposition:  Home  Discharge Condition:Stable CODE STATUS:FULL Diet recommendation: Heart Healthy   Brief/Interim Summary:  Patient is a 75 year old female with history of diabetes type 2, hypertension, GERD, dementia, stage II CKD who presented to the emergency department with complaints of low blood sugar.  She lives with her daughter. She is ambulatory without any problem. At home she was noted to have blood glucose of 26.  When EMS arrived, she was given on her level of D10 which elevated her glucose to 121.  On presentation she was hypothermic otherwise hemodynamically stable.  Lactate she was elevated.  EKG showed J-point elevation .  Patient was also given a dose of broad-spectrum antibiotics: Cefepime ,vancomycin, Flagyl for suspected sepsis due to hypothermia ,started on IV fluids. She was continued on ceftriaxone for presumed UTI: She also had elevated lactic acid level. Lactic acidosis has resolved Her hypothermia resolved, cultures have not shown any growth.  Antibiotics discontinued.  Her blood sugars have improved.  She was taking glipizide and metformin at home.  Her hemoglobin A1c is 5.1 only.  Her diabetes is pretty well controlled.  We will continue these medicines, I have discussed with the daughter and have recommended her to follow-up with her PCP in a week. Patient seen and examined the bedside this morning .  During my evaluation, she was hemodynamically stable.  She was alert, awake, communicated well and knew that she is in the hospital.  Daughter at bedside.  She is medically stable for discharge home today.  I have recommended her to monitor her blood sugars frequently at home.  Discharge Diagnoses:  Active Problems:   Hypoglycemia    Discharge  Instructions  Discharge Instructions     Diet - low sodium heart healthy   Complete by: As directed    Discharge instructions   Complete by: As directed    1)Your diabetes is pretty well controlled.  You are on it diabetic medications.  We have discontinued them.  Please follow-up with your PCP in a week.Monitor your blood glucose at home   Increase activity slowly   Complete by: As directed       Allergies as of 03/24/2021       Reactions   Ciprofloxacin    Other reaction(s): Unknown   Nitrofurantoin    Other reaction(s): Unknown   Other    Pt states allergic to two medications but does not know name.        Medication List     STOP taking these medications    glipiZIDE 10 MG tablet Commonly known as: GLUCOTROL   metFORMIN 1000 MG tablet Commonly known as: GLUCOPHAGE   traMADol 50 MG tablet Commonly known as: ULTRAM       TAKE these medications    amLODipine 10 MG tablet Commonly known as: NORVASC Take 10 mg by mouth daily.   aspirin EC 81 MG tablet Take 81 mg by mouth daily.   atorvastatin 20 MG tablet Commonly known as: LIPITOR Take 20 mg by mouth daily.   blood glucose meter kit and supplies Kit Dispense based on patient and insurance preference. Use up to four times daily as directed.   donepezil 10 MG tablet Commonly known as: ARICEPT Take 10 mg by mouth daily.   gabapentin 300 MG capsule Commonly known as: NEURONTIN Take 600  mg by mouth 3 (three) times daily.   GNP Co Q10 200 MG capsule Generic drug: Coenzyme Q10 Take 200 mg by mouth daily.   lisinopril 40 MG tablet Commonly known as: ZESTRIL Take 40 mg by mouth daily.   omeprazole 40 MG capsule Commonly known as: PRILOSEC Take 40 mg by mouth daily.   spironolactone 25 MG tablet Commonly known as: ALDACTONE Take 25 mg by mouth 2 (two) times daily.   vitamin B-12 1000 MCG tablet Commonly known as: CYANOCOBALAMIN Take 1,000 mcg by mouth every other day.   Vitamin D  (Ergocalciferol) 1.25 MG (50000 UNIT) Caps capsule Commonly known as: DRISDOL Take 50,000 Units by mouth every 30 (thirty) days.        Follow-up Information     [facility] Schedule an appointment as soon as possible for a visit in 1 week(s).   Contact information: [address] [place] [address] [phone]                Allergies  Allergen Reactions   Ciprofloxacin     Other reaction(s): Unknown   Nitrofurantoin     Other reaction(s): Unknown   Other     Pt states allergic to two medications but does not know name.    Consultations: None   Procedures/Studies: DG Chest Port 1 View  Result Date: 03/23/2021 CLINICAL DATA:  Hypoglycemia EXAM: PORTABLE CHEST 1 VIEW COMPARISON:  None. FINDINGS: The heart size and mediastinal contours are within normal limits. Both lungs are clear. The visualized skeletal structures are unremarkable. IMPRESSION: No active disease. Electronically Signed   By: Rolm Baptise M.D.   On: 03/23/2021 02:14      Subjective: Patient seen and examined the bedside this morning.  Hemodynamically stable for discharge today   Discharge Exam: Vitals:   03/24/21 0358 03/24/21 0806  BP: 139/68 132/74  Pulse: 60   Resp: 18 17  Temp: 98.2 F (36.8 C) 98 F (36.7 C)  SpO2: 100% 100%   Vitals:   03/23/21 1813 03/23/21 2003 03/24/21 0358 03/24/21 0806  BP: (!) 169/76 (!) 127/59 139/68 132/74  Pulse: 65 (!) 57 60   Resp: _0 Temp: 98 F (36.7 C) 98.3 F (36.8 C) 98.2 F (36.8 C) 98 F (36.7 C)  TempSrc: Oral Oral Oral Oral  SpO2: 100% 100% 100% 100%  Weight:      Height:        General: Pt is alert, awake, not in acute distress Cardiovascular: RRR, S1/S2 +, no rubs, no gallops Respiratory: CTA bilaterally, no wheezing, no rhonchi Abdominal: Soft, NT, ND, bowel sounds + Extremities: no edema, no cyanosis    The results of significant diagnostics from this hospitalization (including imaging,  microbiology, ancillary and laboratory) are listed below for reference.     Microbiology: Recent Results (from the past 240 hour(s))  Resp Panel by RT-PCR (Flu A&B, Covid) Nasopharyngeal Swab     Status: None   Collection Time: 03/23/21  1:57 AM   Specimen: Nasopharyngeal Swab; Nasopharyngeal(NP) swabs in vial transport medium  Result Value Ref Range Status   SARS Coronavirus 2 by RT PCR NEGATIVE NEGATIVE Final    Comment: (NOTE) SARS-CoV-2 target nucleic acids are NOT DETECTED.  The SARS-CoV-2 RNA is generally detectable in upper respiratory specimens during the acute phase of infection. The lowest concentration of SARS-CoV-2 viral copies this assay can detect is 138 copies/mL. A negative result does not preclude SARS-Cov-2 infection and should  not be used as the sole basis for treatment or other patient management decisions. A negative result may occur with  improper specimen collection/handling, submission of specimen other than nasopharyngeal swab, presence of viral mutation(s) within the areas targeted by this assay, and inadequate number of viral copies(<138 copies/mL). A negative result must be combined with clinical observations, patient history, and epidemiological information. The expected result is Negative.  Fact Sheet for Patients:  EntrepreneurPulse.com.au  Fact Sheet for Healthcare Providers:  IncredibleEmployment.be  This test is no t yet approved or cleared by the [place] FDA and  has been authorized for detection and/or diagnosis of SARS-CoV-2 by FDA under an Emergency Use Authorization (EUA). This EUA will remain  in effect (meaning this test can be used) for the duration of the COVID-19 declaration under Section 564(b)(1) of the Act, 21 U.S.C.section 360bbb-3(b)(1), unless the authorization is terminated  or revoked sooner.       Influenza A by PCR NEGATIVE NEGATIVE Final   Influenza B by PCR NEGATIVE NEGATIVE  Final    Comment: (NOTE) The Xpert Xpress SARS-CoV-2/FLU/RSV plus assay is intended as an aid in the diagnosis of influenza from Nasopharyngeal swab specimens and should not be used as a sole basis for treatment. Nasal washings and aspirates are unacceptable for Xpert Xpress SARS-CoV-2/FLU/RSV testing.  Fact Sheet for Patients: EntrepreneurPulse.com.au  Fact Sheet for Healthcare Providers: IncredibleEmployment.be  This test is not yet approved or cleared by the [place] FDA and has been authorized for detection and/or diagnosis of SARS-CoV-2 by FDA under an Emergency Use Authorization (EUA). This EUA will remain in effect (meaning this test can be used) for the duration of the COVID-19 declaration under Section 564(b)(1) of the Act, 21 U.S.C. section 360bbb-3(b)(1), unless the authorization is terminated or revoked.  Performed at [facility], [address], [place], [place] [address]   Urine Culture     Status: None   Collection Time: 03/23/21  2:42 AM   Specimen: Urine, Clean Catch  Result Value Ref Range Status   Specimen Description   Final    URINE, CLEAN CATCH Performed at [facility], [address] [place], [place] [address]    Special Requests   Final    NONE Performed at [facility], [address], [place], [place] [address]    Culture   Final    NO GROWTH Performed at [facility], [place] [address], [place], [place] [address]    Report Status 03/24/2021 FINAL  Final  Culture, blood (x 2)     Status: None (Preliminary result)   Collection Time: 03/23/21  6:48 AM   Specimen: BLOOD LEFT HAND  Result Value Ref Range Status   Specimen Description BLOOD LEFT HAND  Final   Special Requests   Final    BOTTLES DRAWN AEROBIC AND ANAEROBIC Blood Culture adequate volume   Culture   Final    NO GROWTH < 24 HOURS Performed at [facility], [address] [place], [place]  [address]    Report Status PENDING  Incomplete  Culture, blood (x 2)     Status: None (Preliminary result)   Collection Time: 03/23/21  6:59 AM   Specimen: BLOOD RIGHT HAND  Result Value Ref Range Status   Specimen Description BLOOD RIGHT HAND  Final   Special Requests   Final    BOTTLES DRAWN AEROBIC AND ANAEROBIC Blood Culture adequate volume   Culture   Final    NO GROWTH <  24 HOURS Performed at [facility], [address] [place], [place] [address]    Report Status PENDING  Incomplete     Labs: BNP (last 3 results) No results for input(s): BNP in the last 8760 hours. Basic Metabolic Panel: Recent Labs  Lab 03/23/21 0143 03/23/21 0515 03/24/21 0435  NA 137 138 142  K 4.2 4.3 3.9  CL 104 107 111  CO2 _0 GLUCOSE 166* 139* 84  BUN _1 CREATININE 1.24* 1.06* 0.94  CALCIUM 9.3 9.0 9.0   Liver Function Tests: Recent Labs  Lab 03/23/21 0143 03/23/21 0515  AST 20 20  ALT 14 12  ALKPHOS 35* 31*  BILITOT 0.8 0.6  PROT 6.9 5.9*  ALBUMIN 3.6 3.1*   No results for input(s): LIPASE, AMYLASE in the last 168 hours. No results for input(s): AMMONIA in the last 168 hours. CBC: Recent Labs  Lab 03/23/21 0143 03/23/21 0515 03/24/21 0435  WBC 5.5 4.3 4.5  NEUTROABS 3.9 3.0  --   HGB 11.7* 10.9* 10.5*  HCT 34.0* 32.6* 30.2*  MCV 92.4 91.6 91.2  PLT 211 200 196   Cardiac Enzymes: No results for input(s): CKTOTAL, CKMB, CKMBINDEX, TROPONINI in the last 168 hours. BNP: Invalid input(s): POCBNP CBG: Recent Labs  Lab 03/23/21 1820 03/23/21 2002 03/23/21 2311 03/24/21 0402 03/24/21 0752  GLUCAP 110* 104* 84 82 84   D-Dimer No results for input(s): DDIMER in the last 72 hours. Hgb A1c Recent Labs    03/23/21 0515  HGBA1C 5.1   Lipid Profile No results for input(s): CHOL, HDL, LDLCALC, TRIG, CHOLHDL, LDLDIRECT in the last 72 hours. Thyroid function studies No results for input(s): TSH, T4TOTAL, T3FREE, THYROIDAB in the last 72  hours.  Invalid input(s): FREET3 Anemia work up No results for input(s): VITAMINB12, FOLATE, FERRITIN, TIBC, IRON, RETICCTPCT in the last 72 hours. Urinalysis    Component Value Date/Time   COLORURINE YELLOW 03/23/2021 0242   APPEARANCEUR CLEAR 03/23/2021 0242   LABSPEC 1.015 03/23/2021 0242   PHURINE 5.5 03/23/2021 0242   GLUCOSEU NEGATIVE 03/23/2021 0242   HGBUR NEGATIVE 03/23/2021 0242   BILIRUBINUR NEGATIVE 03/23/2021 0242   KETONESUR NEGATIVE 03/23/2021 0242   PROTEINUR NEGATIVE 03/23/2021 0242   NITRITE NEGATIVE 03/23/2021 0242   LEUKOCYTESUR NEGATIVE 03/23/2021 0242   Sepsis Labs Invalid input(s): PROCALCITONIN,  WBC,  LACTICIDVEN Microbiology Recent Results (from the past 240 hour(s))  Resp Panel by RT-PCR (Flu A&B, Covid) Nasopharyngeal Swab     Status: None   Collection Time: 03/23/21  1:57 AM   Specimen: Nasopharyngeal Swab; Nasopharyngeal(NP) swabs in vial transport medium  Result Value Ref Range Status   SARS Coronavirus 2 by RT PCR NEGATIVE NEGATIVE Final    Comment: (NOTE) SARS-CoV-2 target nucleic acids are NOT DETECTED.  The SARS-CoV-2 RNA is generally detectable in upper respiratory specimens during the acute phase of infection. The lowest concentration of SARS-CoV-2 viral copies this assay can detect is 138 copies/mL. A negative result does not preclude SARS-Cov-2 infection and should not be used as the sole basis for treatment or other patient management decisions. A negative result may occur with  improper specimen collection/handling, submission of specimen other than nasopharyngeal swab, presence of viral mutation(s) within the areas targeted by this assay, and inadequate number of viral copies(<138 copies/mL). A negative result must be combined with clinical observations, patient history, and epidemiological information. The expected result is Negative.  Fact Sheet for Patients:  EntrepreneurPulse.com.au  Fact Sheet for  Healthcare Providers:  IncredibleEmployment.be  This test is no t yet approved or cleared by the [place] and  has been authorized for detection and/or diagnosis of SARS-CoV-2 by FDA under an Emergency Use Authorization (EUA). This EUA will remain  in effect (meaning this test can be used) for the duration of the COVID-19 declaration under Section 564(b)(1) of the Act, 21 U.S.C.section 360bbb-3(b)(1), unless the authorization is terminated  or revoked sooner.       Influenza A by PCR NEGATIVE NEGATIVE Final   Influenza B by PCR NEGATIVE NEGATIVE Final    Comment: (NOTE) The Xpert Xpress SARS-CoV-2/FLU/RSV plus assay is intended as an aid in the diagnosis of influenza from Nasopharyngeal swab specimens and should not be used as a sole basis for treatment. Nasal washings and aspirates are unacceptable for Xpert Xpress SARS-CoV-2/FLU/RSV testing.  Fact Sheet for Patients: EntrepreneurPulse.com.au  Fact Sheet for Healthcare Providers: IncredibleEmployment.be  This test is not yet approved or cleared by the [place] FDA and has been authorized for detection and/or diagnosis of SARS-CoV-2 by FDA under an Emergency Use Authorization (EUA). This EUA will remain in effect (meaning this test can be used) for the duration of the COVID-19 declaration under Section 564(b)(1) of the Act, 21 U.S.C. section 360bbb-3(b)(1), unless the authorization is terminated or revoked.  Performed at [facility], [address], [place], [place] [address]   Urine Culture     Status: None   Collection Time: 03/23/21  2:42 AM   Specimen: Urine, Clean Catch  Result Value Ref Range Status   Specimen Description   Final    URINE, CLEAN CATCH Performed at [facility], [address] [place], [place] [address]    Special Requests   Final    NONE Performed at [facility], [address], [place],  [place] [address]    Culture   Final    NO GROWTH Performed at [facility], [place] [address] [place], [place] [address]    Report Status 03/24/2021 FINAL  Final  Culture, blood (x 2)     Status: None (Preliminary result)   Collection Time: 03/23/21  6:48 AM   Specimen: BLOOD LEFT HAND  Result Value Ref Range Status   Specimen Description BLOOD LEFT HAND  Final   Special Requests   Final    BOTTLES DRAWN AEROBIC AND ANAEROBIC Blood Culture adequate volume   Culture   Final    NO GROWTH < 24 HOURS Performed at [facility], [address], [place], [place] [address]    Report Status PENDING  Incomplete  Culture, blood (x 2)     Status: None (Preliminary result)   Collection Time: 03/23/21  6:59 AM   Specimen: BLOOD RIGHT HAND  Result Value Ref Range Status   Specimen Description BLOOD RIGHT HAND  Final   Special Requests   Final    BOTTLES DRAWN AEROBIC AND ANAEROBIC Blood Culture adequate volume   Culture   Final    NO GROWTH < 24 HOURS Performed at [facility], [address], [place], [place] [address]    Report Status PENDING  Incomplete    Please note: You were cared for by a hospitalist during your hospital stay. Once you are discharged, your primary care physician will handle any further medical issues. Please note that NO REFILLS for any discharge medications will be authorized once you are discharged, as it is imperative that you return to your primary care  physician (or establish a relationship with a primary care physician if you do not have one) for your post hospital discharge needs so that they can reassess your need for medications and monitor your lab values.    Time coordinating discharge: 40 minutes  SIGNED:   Shelly Coss, MD  [facility] 03/24/2021, 10:57 AM Pager [phone]  If 7PM-7AM, please contact night-coverage [email or web address] Password [email or web address]

## 2021-03-24 NOTE — Progress Notes (Signed)
Patient discharge home Spoke with patient's daughter via phone post discharge Patient lives at home with daughter Destiny Vazquez [address] [place] Family transports patient to appointments Denies needs for home health PT Requested home health RN for medication management States they do not have a preference of home health agency.  Referral made to [facility] with [facility]

## 2021-03-28 LAB — CULTURE, BLOOD (ROUTINE X 2)
Culture: NO GROWTH
Culture: NO GROWTH
Special Requests: ADEQUATE
Special Requests: ADEQUATE

## 2022-05-24 ENCOUNTER — Other Ambulatory Visit: Payer: Self-pay | Admitting: Obstetrics and Gynecology

## 2022-06-02 IMAGING — DX DG CHEST 1V PORT
1 series · 1 of 1 positions shown · non-contrast
Comparison: None.

CLINICAL DATA: Hypoglycemia

EXAM:
PORTABLE CHEST 1 VIEW

[chest ap]
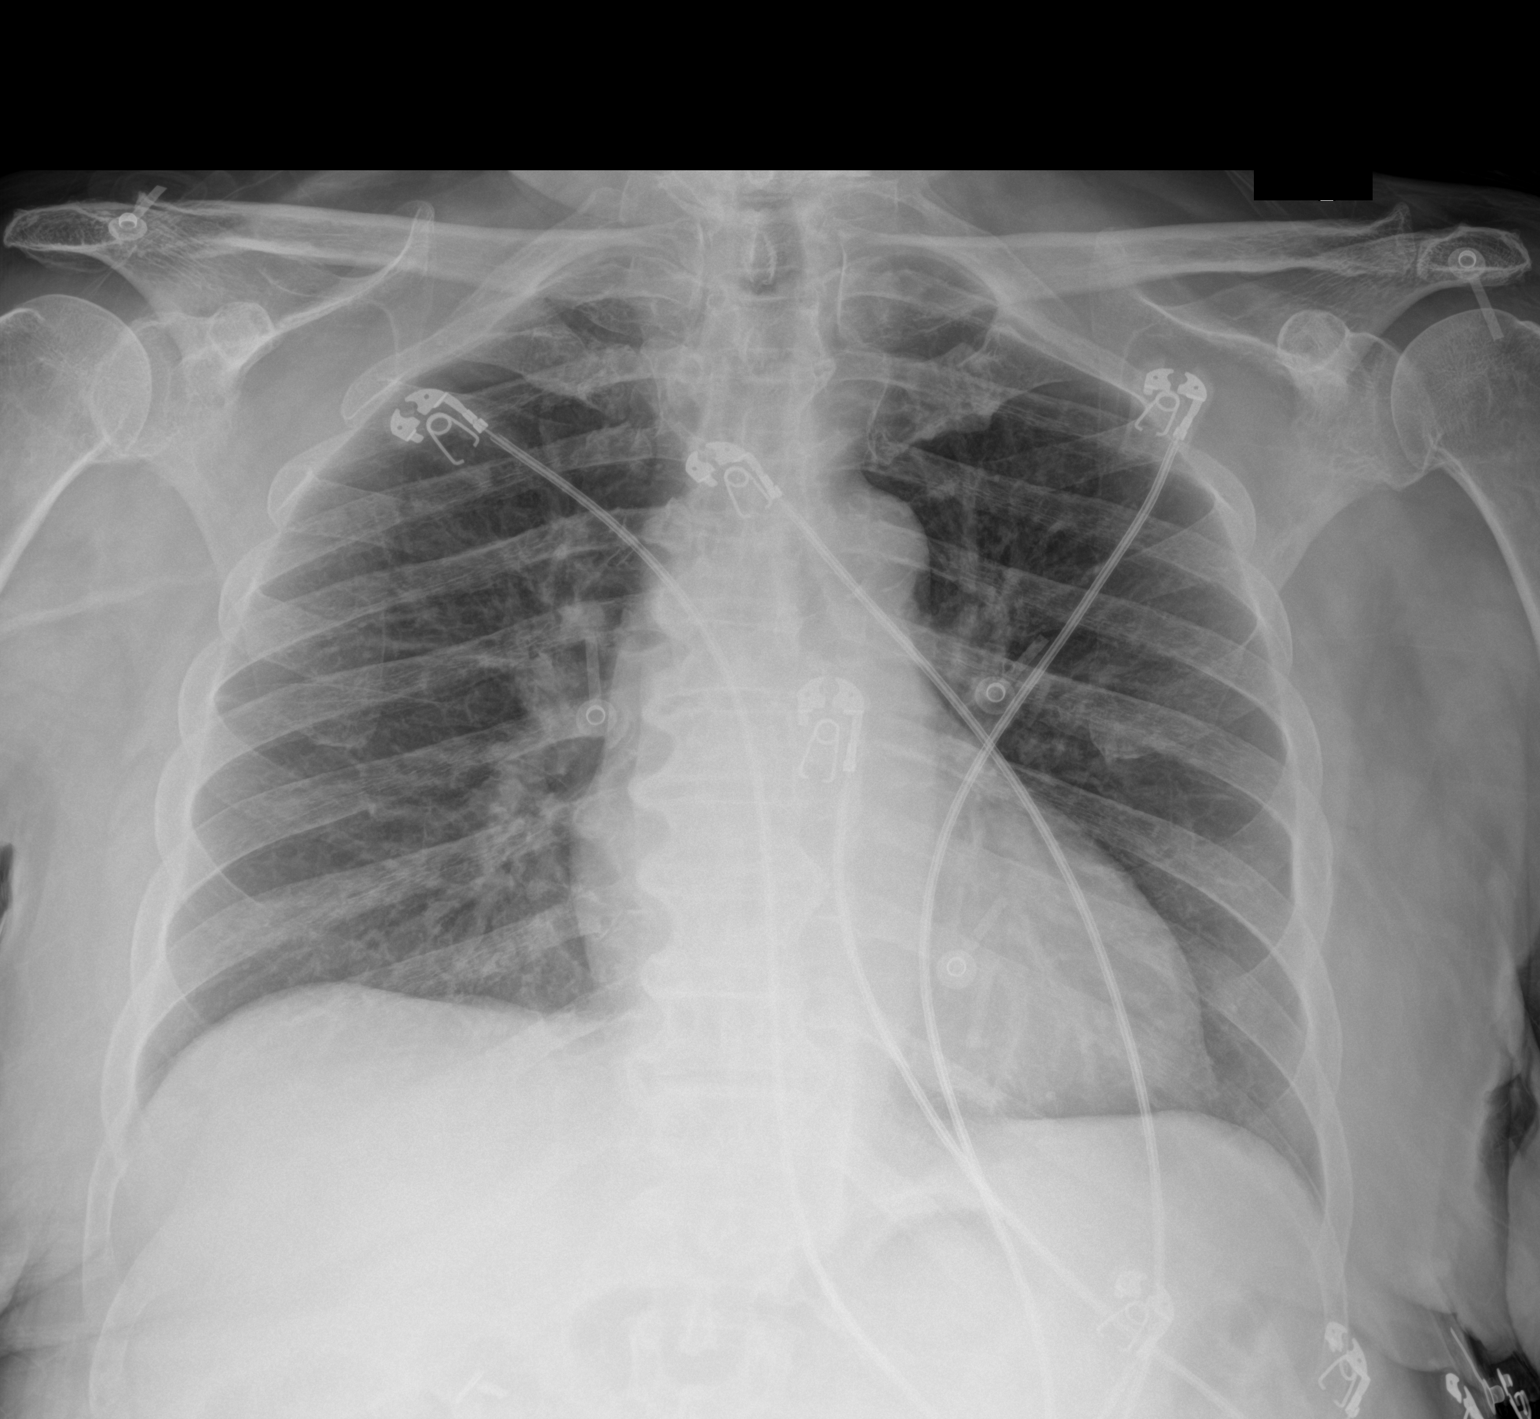

[1 of 1 positions shown; findings below may reference images not displayed]

FINDINGS: The heart size and mediastinal contours are within normal limits.
Both lungs are clear. The visualized skeletal structures are
unremarkable.
IMPRESSION: No active disease.

## 2022-06-19 ENCOUNTER — Encounter (HOSPITAL_BASED_OUTPATIENT_CLINIC_OR_DEPARTMENT_OTHER): Payer: Self-pay

## 2022-06-19 ENCOUNTER — Ambulatory Visit (HOSPITAL_BASED_OUTPATIENT_CLINIC_OR_DEPARTMENT_OTHER): Admit: 2022-06-19 | Payer: Medicare HMO | Admitting: Obstetrics and Gynecology

## 2022-06-19 SURGERY — DILATATION & CURETTAGE/HYSTEROSCOPY WITH MYOSURE
Anesthesia: General

## 2023-04-21 ENCOUNTER — Emergency Department: Payer: Medicare PPO

## 2023-04-21 ENCOUNTER — Inpatient Hospital Stay
Admission: EM | Admit: 2023-04-21 | Discharge: 2023-04-23 | DRG: 684 | Disposition: A | Discharge status: Home Health | Payer: Medicare PPO | Attending: Internal Medicine | Admitting: Internal Medicine

## 2023-04-21 ENCOUNTER — Other Ambulatory Visit: Payer: Self-pay

## 2023-04-21 ENCOUNTER — Encounter: Payer: Self-pay | Admitting: Internal Medicine

## 2023-04-21 DIAGNOSIS — R197 Diarrhea, unspecified: Secondary | ICD-10-CM | POA: Diagnosis present

## 2023-04-21 DIAGNOSIS — Z79899 Other long term (current) drug therapy: Secondary | ICD-10-CM

## 2023-04-21 DIAGNOSIS — E1122 Type 2 diabetes mellitus with diabetic chronic kidney disease: Secondary | ICD-10-CM | POA: Diagnosis present

## 2023-04-21 DIAGNOSIS — R627 Adult failure to thrive: Secondary | ICD-10-CM | POA: Diagnosis not present

## 2023-04-21 DIAGNOSIS — F0390 Unspecified dementia without behavioral disturbance: Secondary | ICD-10-CM | POA: Diagnosis present

## 2023-04-21 DIAGNOSIS — Z888 Allergy status to other drugs, medicaments and biological substances status: Secondary | ICD-10-CM

## 2023-04-21 DIAGNOSIS — I959 Hypotension, unspecified: Secondary | ICD-10-CM

## 2023-04-21 DIAGNOSIS — Z90710 Acquired absence of both cervix and uterus: Secondary | ICD-10-CM

## 2023-04-21 DIAGNOSIS — N179 Acute kidney failure, unspecified: Secondary | ICD-10-CM | POA: Diagnosis not present

## 2023-04-21 DIAGNOSIS — E785 Hyperlipidemia, unspecified: Secondary | ICD-10-CM | POA: Diagnosis present

## 2023-04-21 DIAGNOSIS — I129 Hypertensive chronic kidney disease with stage 1 through stage 4 chronic kidney disease, or unspecified chronic kidney disease: Secondary | ICD-10-CM | POA: Diagnosis present

## 2023-04-21 DIAGNOSIS — N1831 Chronic kidney disease, stage 3a: Secondary | ICD-10-CM | POA: Diagnosis present

## 2023-04-21 DIAGNOSIS — Z8616 Personal history of COVID-19: Secondary | ICD-10-CM

## 2023-04-21 DIAGNOSIS — I10 Essential (primary) hypertension: Secondary | ICD-10-CM | POA: Diagnosis present

## 2023-04-21 DIAGNOSIS — E1129 Type 2 diabetes mellitus with other diabetic kidney complication: Secondary | ICD-10-CM | POA: Diagnosis present

## 2023-04-21 DIAGNOSIS — R159 Full incontinence of feces: Secondary | ICD-10-CM | POA: Diagnosis present

## 2023-04-21 DIAGNOSIS — Z881 Allergy status to other antibiotic agents status: Secondary | ICD-10-CM

## 2023-04-21 DIAGNOSIS — Z7984 Long term (current) use of oral hypoglycemic drugs: Secondary | ICD-10-CM

## 2023-04-21 DIAGNOSIS — E860 Dehydration: Secondary | ICD-10-CM | POA: Diagnosis present

## 2023-04-21 DIAGNOSIS — Z7982 Long term (current) use of aspirin: Secondary | ICD-10-CM

## 2023-04-21 DIAGNOSIS — I9589 Other hypotension: Secondary | ICD-10-CM | POA: Diagnosis present

## 2023-04-21 HISTORY — DX: Unspecified dementia, unspecified severity, without behavioral disturbance, psychotic disturbance, mood disturbance, and anxiety: F03.90

## 2023-04-21 HISTORY — DX: Hypotension, unspecified: I95.9

## 2023-04-21 HISTORY — DX: Hyperlipidemia, unspecified: E78.5

## 2023-04-21 HISTORY — DX: Chronic kidney disease, stage 3a: N18.31

## 2023-04-21 LAB — CBC
HCT: 36.9 % (ref 36.0–46.0)
Hemoglobin: 12.2 g/dL (ref 12.0–15.0)
MCH: 30 pg (ref 26.0–34.0)
MCHC: 33.1 g/dL (ref 30.0–36.0)
MCV: 90.9 fL (ref 80.0–100.0)
Platelets: 278 10*3/uL (ref 150–400)
RBC: 4.06 MIL/uL (ref 3.87–5.11)
RDW: 13.2 % (ref 11.5–15.5)
WBC: 6.8 10*3/uL (ref 4.0–10.5)
nRBC: 0 % (ref 0.0–0.2)

## 2023-04-21 LAB — COMPREHENSIVE METABOLIC PANEL
ALT: 12 U/L (ref 0–44)
AST: 21 U/L (ref 15–41)
Albumin: 3.6 g/dL (ref 3.5–5.0)
Alkaline Phosphatase: 50 U/L (ref 38–126)
Anion gap: 11 (ref 5–15)
BUN: 61 mg/dL — ABNORMAL HIGH (ref 8–23)
CO2: 22 mmol/L (ref 22–32)
Calcium: 9.3 mg/dL (ref 8.9–10.3)
Chloride: 105 mmol/L (ref 98–111)
Creatinine, Ser: 3.42 mg/dL — ABNORMAL HIGH (ref 0.44–1.00)
GFR, Estimated: 13 mL/min — ABNORMAL LOW (ref >60)
Glucose, Bld: 127 mg/dL — ABNORMAL HIGH (ref 70–99)
Potassium: 4.5 mmol/L (ref 3.5–5.1)
Sodium: 138 mmol/L (ref 135–145)
Total Bilirubin: 0.7 mg/dL (ref 0.3–1.2)
Total Protein: 7.8 g/dL (ref 6.5–8.1)

## 2023-04-21 LAB — CBG MONITORING, ED
Glucose-Capillary: 116 mg/dL — ABNORMAL HIGH (ref 70–99)
Glucose-Capillary: 98 mg/dL (ref 70–99)

## 2023-04-21 MED ORDER — INSULIN ASPART 100 UNIT/ML IJ SOLN: 0.0000 [IU] | Freq: Every day | INTRAMUSCULAR | Status: DC

## 2023-04-21 MED ORDER — ONDANSETRON HCL 4 MG/2ML IJ SOLN: 4.0000 mg | Freq: Three times a day (TID) | INTRAMUSCULAR | Status: DC | PRN

## 2023-04-21 MED ORDER — ONDANSETRON HCL 4 MG/2ML IJ SOLN
4.0000 mg | Freq: Once | INTRAMUSCULAR | Status: AC
  Administered 2023-04-21: 4 mg via INTRAVENOUS
  Filled 2023-04-21: qty 2

## 2023-04-21 MED ORDER — PANTOPRAZOLE SODIUM 40 MG PO TBEC
40.0000 mg | DELAYED_RELEASE_TABLET | Freq: Every day | ORAL | Status: DC
  Administered 2023-04-22 – 2023-04-23 (×2): 40 mg via ORAL
  Filled 2023-04-21 (×2): qty 1

## 2023-04-21 MED ORDER — HYDRALAZINE HCL 20 MG/ML IJ SOLN: 5.0000 mg | INTRAMUSCULAR | Status: DC | PRN

## 2023-04-21 MED ORDER — INSULIN ASPART 100 UNIT/ML IJ SOLN: 0.0000 [IU] | Freq: Three times a day (TID) | INTRAMUSCULAR | Status: DC

## 2023-04-21 MED ORDER — ATORVASTATIN CALCIUM 20 MG PO TABS
20.0000 mg | ORAL_TABLET | Freq: Every day | ORAL | Status: DC
  Administered 2023-04-22 – 2023-04-23 (×2): 20 mg via ORAL
  Filled 2023-04-21 (×2): qty 1

## 2023-04-21 MED ORDER — ACETAMINOPHEN 325 MG PO TABS: 650.0000 mg | ORAL_TABLET | Freq: Four times a day (QID) | ORAL | Status: DC | PRN

## 2023-04-21 MED ORDER — SODIUM CHLORIDE 0.9 % IV SOLN: INTRAVENOUS | Status: DC

## 2023-04-21 MED ORDER — HEPARIN SODIUM (PORCINE) 5000 UNIT/ML IJ SOLN
5000.0000 [IU] | Freq: Three times a day (TID) | INTRAMUSCULAR | Status: DC
  Administered 2023-04-21 – 2023-04-23 (×5): 5000 [IU] via SUBCUTANEOUS
  Filled 2023-04-21 (×5): qty 1

## 2023-04-21 MED ORDER — SODIUM CHLORIDE 0.9 % IV BOLUS
1000.0000 mL | Freq: Once | INTRAVENOUS | Status: AC
  Administered 2023-04-21: 1000 mL via INTRAVENOUS

## 2023-04-21 NOTE — H&P (Signed)
History and Physical    Destiny Vazquez ZOX:[id number] DOB: 10-23-1945 DOA: 04/21/2023  Referring MD/NP/PA:   PCP: [facility]   Patient coming from:  The patient is coming from home.     Chief Complaint: weakness and failure to thrive  HPI: Destiny Vazquez is a 77 y.o. female with medical history significant of dementia, hypertension, hyperlipidemia, diabetes mellitus, CKD-3, recent COVID infection, who presents with weakness and failure to thrive.  Per her daughter at the bedside, patient had COVID 1 month ago which was not treated with specific anti-COVID antibiotics.  Her symptoms has resolved.  After COVID infection, patient's condition has been declining.  She has generalized weakness, poor appetite and decreased oral intake.  Patient has slower gait, poor balance, seem to be dragging her left lower extremities, but no leg weakness on my examination.  No facial droop or slurred speech. Patient has diarrhea, 2 or 3 times of diarrhea each day. No nausea, vomiting or abdominal pain.  No cough, chest pain, SOB.  Her mental status is at baseline.  Per her daughter, at her normal baseline, patient is not oriented x 3.  When I saw patient in ED, she is alert, knows her own name, not orientated to time and place.  Patient was found to have hypotension with blood pressure 80/45 which improved to 100/53 after giving 1 L normal saline bolus in ED.   Data reviewed independently and ED Course: pt was found to have WBC 6.0, worsening renal function with creatinine 3.42, BUN 61, GFR 13 (baseline creatinine 0.94 on 03/24/2021), temperature normal, heart rate 50-60s, RR 16, oxygen saturation 100% on room air.  CT of head negative.  CT of abdomen/pelvis is negative for acute issues.  Patient is placed on MedSurg bed for observation.   EKG: I have personally reviewed.  Sinus rhythm, QTc 427, low voltage   Review of Systems:   General: no fevers, chills, no body weight gain, has poor  appetite, has fatigue HEENT: no blurry vision, hearing changes or sore throat Respiratory: no dyspnea, coughing, wheezing CV: no chest pain, no palpitations GI: no nausea, vomiting, abdominal pain, has diarrhea, no constipation GU: no dysuria, burning on urination, increased urinary frequency, hematuria  Ext: no leg edema Neuro: no unilateral weakness, numbness, or tingling, no vision change or hearing loss Skin: no rash, no skin tear. MSK: No muscle spasm, no deformity, no limitation of range of movement in spin Heme: No easy bruising.  Travel history: No recent long distant travel.   Allergy:  Allergies  Allergen Reactions   Ciprofloxacin     Other reaction(s): Unknown   Nitrofurantoin     Other reaction(s): Unknown   Other     Pt states allergic to two medications but does not know name.    Past Medical History:  Diagnosis Date   Chronic renal failure, stage 3a (HCC)    Dementia (HCC)    Diabetes mellitus without complication (HCC)    HLD (hyperlipidemia)    Hypertension    Renal disorder     Past Surgical History:  Procedure Laterality Date   ABDOMINAL HYSTERECTOMY     CHOLECYSTECTOMY      Social History:  reports that she has never smoked. She has never used smokeless tobacco. She reports that she does not drink alcohol and does not use drugs.  Family History: No family history on file.  Could not be reviewed due to dementia.  Prior to Admission medications   Medication  Sig Start Date End Date Taking? Authorizing Provider  amLODipine (NORVASC) 10 MG tablet Take 10 mg by mouth daily. 02/22/21   [provider]  aspirin EC 81 MG tablet Take 81 mg by mouth daily.    [provider]  atorvastatin (LIPITOR) 20 MG tablet Take 20 mg by mouth daily. 02/22/21   [provider]  blood glucose meter kit and supplies KIT Dispense based on patient and insurance preference. Use up to four times daily as directed. 03/24/21   Burnadette Pop, MD  donepezil  (ARICEPT) 10 MG tablet Take 10 mg by mouth daily. 02/22/21   [provider]  gabapentin (NEURONTIN) 300 MG capsule Take 600 mg by mouth 3 (three) times daily.    [provider]  GNP CO Q10 200 MG capsule Take 200 mg by mouth daily. 02/22/21   [provider]  lisinopril (PRINIVIL,ZESTRIL) 40 MG tablet Take 40 mg by mouth daily.    [provider]  omeprazole (PRILOSEC) 40 MG capsule Take 40 mg by mouth daily.    [provider]  spironolactone (ALDACTONE) 25 MG tablet Take 25 mg by mouth 2 (two) times daily.    [provider]  vitamin B-12 (CYANOCOBALAMIN) 1000 MCG tablet Take 1,000 mcg by mouth every other day. 02/22/21   [provider]  Vitamin D, Ergocalciferol, (DRISDOL) 50000 units CAPS capsule Take 50,000 Units by mouth every 30 (thirty) days.     [provider]    Physical Exam: Vitals:   04/21/23 1900 04/21/23 1955 04/21/23 2000 04/21/23 2212  BP: 109/73  105/61 95/78  Pulse: 62  62 75  Resp: 16   14  Temp:  97.9 F (36.6 C)    TempSrc:  Oral    SpO2: 100%  96% 100%  Weight:      Height:       General: Not in acute distress.  Dry mucosal membrane HEENT:       Eyes: PERRL, EOMI, no jaundice       ENT: No discharge from the ears and nose, no pharynx injection, no tonsillar enlargement.        Neck: No JVD, no bruit, no mass felt. Heme: No neck lymph node enlargement. Cardiac: S1/S2, RRR, No murmurs, No gallops or rubs. Respiratory: No rales, wheezing, rhonchi or rubs. GI: Soft, nondistended, nontender, no rebound pain, no organomegaly, BS present. GU: No hematuria Ext: No pitting leg edema bilaterally. 1+DP/PT pulse bilaterally. Musculoskeletal: No joint deformities, No joint redness or warmth, no limitation of ROM in spin. Skin: No rashes.  Neuro: Alert, knows her own name, not orientated to the place and time, cranial nerves II-XII grossly intact, moves all extremities Psych: Patient is not psychotic,  no suicidal or hemocidal ideation.  Labs on Admission: I have personally reviewed following labs and imaging studies  CBC: Recent Labs  Lab 04/21/23 1500  WBC 6.8  HGB 12.2  HCT 36.9  MCV 90.9  PLT 278   Basic Metabolic Panel: Recent Labs  Lab 04/21/23 1500  NA 138  K 4.5  CL 105  CO2 22  GLUCOSE 127*  BUN 61*  CREATININE 3.42*  CALCIUM 9.3   GFR: Estimated Creatinine Clearance: 12.8 mL/min (A) (by C-G formula based on SCr of 3.42 mg/dL (H)). Liver Function Tests: Recent Labs  Lab 04/21/23 1500  AST 21  ALT 12  ALKPHOS 50  BILITOT 0.7  PROT 7.8  ALBUMIN 3.6   No results for input(s): "LIPASE", "AMYLASE" in  the last 168 hours. No results for input(s): "AMMONIA" in the last 168 hours. Coagulation Profile: No results for input(s): "INR", "PROTIME" in the last 168 hours. Cardiac Enzymes: No results for input(s): "CKTOTAL", "CKMB", "CKMBINDEX", "TROPONINI" in the last 168 hours. BNP (last 3 results) No results for input(s): "PROBNP" in the last 8760 hours. HbA1C: No results for input(s): "HGBA1C" in the last 72 hours. CBG: Recent Labs  Lab 04/21/23 1502 04/21/23 2116  GLUCAP 116* 98   Lipid Profile: No results for input(s): "CHOL", "HDL", "LDLCALC", "TRIG", "CHOLHDL", "LDLDIRECT" in the last 72 hours. Thyroid Function Tests: No results for input(s): "TSH", "T4TOTAL", "FREET4", "T3FREE", "THYROIDAB" in the last 72 hours. Anemia Panel: No results for input(s): "VITAMINB12", "FOLATE", "FERRITIN", "TIBC", "IRON", "RETICCTPCT" in the last 72 hours. Urine analysis:    Component Value Date/Time   COLORURINE YELLOW 03/23/2021 0242   APPEARANCEUR CLEAR 03/23/2021 0242   LABSPEC 1.015 03/23/2021 0242   PHURINE 5.5 03/23/2021 0242   GLUCOSEU NEGATIVE 03/23/2021 0242   HGBUR NEGATIVE 03/23/2021 0242   BILIRUBINUR NEGATIVE 03/23/2021 0242   KETONESUR NEGATIVE 03/23/2021 0242   PROTEINUR NEGATIVE 03/23/2021 0242   NITRITE NEGATIVE 03/23/2021 0242    LEUKOCYTESUR NEGATIVE 03/23/2021 0242   Sepsis Labs: @LABRCNTIP (procalcitonin:4,lacticidven:4) )No results found for this or any previous visit (from the past 240 hour(s)).   Radiological Exams on Admission: CT Head Wo Contrast  Result Date: 04/21/2023 CLINICAL DATA:  Altered mental status EXAM: CT HEAD WITHOUT CONTRAST TECHNIQUE: Contiguous axial images were obtained from the base of the skull through the vertex without intravenous contrast. RADIATION DOSE REDUCTION: This exam was performed according to the departmental dose-optimization program which includes automated exposure control, adjustment of the mA and/or kV according to patient size and/or use of iterative reconstruction technique. COMPARISON:  None Available. FINDINGS: Cardiovascular: No significant vascular findings. Normal heart size. No pericardial effusion. Periventricular white matter hypodensity. Mediastinum/Nodes: No enlarged mediastinal, hilar, or axillary lymph nodes. Thyroid gland, trachea, and esophagus demonstrate no significant findings. Lungs/Pleura: Lungs are clear. No pleural effusion or pneumothorax. Upper Abdomen: No acute abnormality. Musculoskeletal: No chest wall abnormality. No acute osseous findings. IMPRESSION: No acute intracranial pathology.  Small-vessel white matter disease. Electronically Signed   By: Jearld Lesch M.D.   On: 04/21/2023 17:58   CT ABDOMEN PELVIS WO CONTRAST  Result Date: 04/21/2023 CLINICAL DATA:  Acute abdominal pain.  Failure to thrive EXAM: CT ABDOMEN AND PELVIS WITHOUT CONTRAST TECHNIQUE: Multidetector CT imaging of the abdomen and pelvis was performed following the standard protocol without IV contrast. RADIATION DOSE REDUCTION: This exam was performed according to the departmental dose-optimization program which includes automated exposure control, adjustment of the mA and/or kV according to patient size and/or use of iterative reconstruction technique. COMPARISON:  None Available.  FINDINGS: Lower chest: There are some linear opacity lung bases likely scar or atelectasis. No pleural effusion. Slight areas of bronchial wall thickening. Mild motion. Coronary artery calcifications are seen. Hepatobiliary: On this non IV contrast exam, grossly preserved hepatic parenchyma. Previous cholecystectomy. Pancreas: Mild pancreatic atrophy.  No obvious mass. Spleen: Normal in size without focal abnormality. Adrenals/Urinary Tract: Slight thickening of the adrenal glands bilaterally, nonspecific. No renal or ureteral stones are identified. No collecting system dilatation. The ureters have normal course and caliber extending down to the bladder. Preserved contours of the underdistended urinary bladder. Stomach/Bowel: There is fluid and debris in the stomach which is underdistended. On this non oral contrast exam, the small bowel is nondilated. Large bowel has a normal  course and caliber. Scattered colonic stool. Few colonic diverticula. Normal appendix extends medial to the cecum in the right lower quadrant. Vascular/Lymphatic: Diffuse scattered vascular calcifications. Normal caliber aorta and IVC. No specific abnormal lymph node enlargement identified in the abdomen and pelvis. Reproductive: Status post hysterectomy. No adnexal masses. Other: Mild anasarca.  No free intra-air or free fluid. Musculoskeletal: Scattered degenerative changes of the spine and pelvis. There is Schmorl's node deformity along the superior endplate of T12. Prominent bridging osteophytes elsewhere along the visualized portions of the lower thoracic spine. IMPRESSION: No bowel obstruction, free air or free fluid. Normal appendix. Few left-sided colonic diverticula. No obstructing renal stone or ureteral stones. Electronically Signed   By: Karen Kays M.D.   On: 04/21/2023 17:55      Assessment/Plan Principal Problem:   Acute kidney injury superimposed on stage 3a chronic kidney disease (HCC) Active Problems:   Hypotension    Failure to thrive in adult   Diarrhea   HLD (hyperlipidemia)   Hypertension   Type II diabetes mellitus with renal manifestations (HCC)   Assessment and Plan:  Principal Problem:   Acute kidney injury superimposed on stage 3a chronic kidney disease (HCC) Active Problems:   Hypotension   Failure to thrive in adult   Diarrhea   HLD (hyperlipidemia)   Hypertension   Type II diabetes mellitus with renal manifestations (HCC)      DVT ppx: SQ Heparin     Code Status: Full code    Family Communication: Yes, patient's daughter   at bed side.      Disposition Plan:  Anticipate discharge back to previous environment  Consults called:  none  Admission status and Level of care: Med-Surg:    for obs    Dispo: The patient is from: Home              Anticipated d/c is to: Home              Anticipated d/c date is: 1 day              Patient currently is not medically stable to d/c.    Severity of Illness:  The appropriate patient status for this patient is OBSERVATION. Observation status is judged to be reasonable and necessary in order to provide the required intensity of service to ensure the patient's safety. The patient's presenting symptoms, physical exam findings, and initial radiographic and laboratory data in the context of their medical condition is felt to place them at decreased risk for further clinical deterioration. Furthermore, it is anticipated that the patient will be medically stable for discharge from the hospital within 2 midnights of admission.        Date of Service 04/21/2023    Lorretta Harp [facility]   If 7PM-7AM, please contact night-coverage [email or web address] 04/21/2023, 11:21 PM

## 2023-04-21 NOTE — ED Provider Notes (Signed)
[facility] Provider Note    Event Date/Time   First MD Initiated Contact with Patient 04/21/23 1515     (approximate)   History   Hypotension and Failure To Thrive   HPI  Destiny Vazquez is a 77 year old female with history of T2DM, HTN, CKD, advanced dementia presenting to the ER for evaluation of weakness.  Accompanied by daughter provides history.  Reports about a month ago patient was diagnosed with COVID.  Her respiratory symptoms have improved but she has had ongoing weakness with poor p.o. intake.  No vomiting.  Is incontinent at baseline, but has had about 2 episodes of nonbloody diarrhea a day for over a week.  About a week ago she noticed that the patient had a slower gait and was dragging her left lower extremity with unsteadiness, but no noted falls.       Physical Exam   Triage Vital Signs: ED Triage Vitals [04/21/23 1458]  Encounter Vitals Group     BP (!) 80/45     Systolic BP Percentile      Diastolic BP Percentile      Pulse Rate 68     Resp 16     Temp 98.6 F (37 C)     Temp Source Oral     SpO2 100 %     Weight 178 lb (80.7 kg)     Height 5\' 1"  (1.549 m)     Head Circumference      Peak Flow      Pain Score      Pain Loc      Pain Education      Exclude from Growth Chart     Most recent vital signs: Vitals:   04/21/23 2000 04/21/23 2212  BP: 105/61 95/78  Pulse: 62 75  Resp:  14  Temp:    SpO2: 96% 100%     General: Awake, interactive  HEENT: Dry mucous membranes CV:  Regular rate, good peripheral perfusion.  Resp:  Lungs clear, unlabored respirations.  Abd:  Soft, nondistended, no severe tenderness to palpation Neuro:  States her name is Diane (patient's daughter) when asked her name, not oriented to place, time, inconsistently following commands, per daughter this is baseline for her, generalized weakness, unable to hold either lower extremity antigravity but difficult to know how much of this is  understanding/volitional  ED Results / Procedures / Treatments   Labs (all labs ordered are listed, but only abnormal results are displayed) Labs Reviewed  COMPREHENSIVE METABOLIC PANEL - Abnormal; Notable for the following components:      Result Value   Glucose, Bld 127 (*)    BUN 61 (*)    Creatinine, Ser 3.42 (*)    GFR, Estimated 13 (*)    All other components within normal limits  CBG MONITORING, ED - Abnormal; Notable for the following components:   Glucose-Capillary 116 (*)    All other components within normal limits  C DIFFICILE QUICK SCREEN W PCR REFLEX    CBC  URINALYSIS, ROUTINE W REFLEX MICROSCOPIC  BASIC METABOLIC PANEL  CBC  CBG MONITORING, ED     EKG EKG independently reviewed interpreted by myself (ER attending) demonstrates:  EKG demonstrates normal sinus at a rate of 70, PR 164, QRS 66, QTc 427, no acute ST changes  RADIOLOGY Imaging independently reviewed and interpreted by myself demonstrates:  CT head without acute bleed CT abdomen pelvis without bowel obstruction, evidence of colitis, diverticulitis  PROCEDURES:  Critical  Care performed: No  Procedures   MEDICATIONS ORDERED IN ED: Medications  0.9 %  sodium chloride infusion ( Intravenous New Bag/Given 04/21/23 2003)  ondansetron (ZOFRAN) injection 4 mg (has no administration in time range)  acetaminophen (TYLENOL) tablet 650 mg (has no administration in time range)  hydrALAZINE (APRESOLINE) injection 5 mg (has no administration in time range)  heparin injection 5,000 Units (5,000 Units Subcutaneous Given 04/21/23 2117)  insulin aspart (novoLOG) injection 0-5 Units ( Subcutaneous Not Given 04/21/23 2117)  insulin aspart (novoLOG) injection 0-9 Units (has no administration in time range)  atorvastatin (LIPITOR) tablet 20 mg (has no administration in time range)  pantoprazole (PROTONIX) EC tablet 40 mg (has no administration in time range)  ondansetron (ZOFRAN) injection 4 mg (4 mg Intravenous  Given 04/21/23 1627)  sodium chloride 0.9 % bolus 1,000 mL (0 mLs Intravenous Stopped 04/21/23 1856)     IMPRESSION / MDM / ASSESSMENT AND PLAN / ED COURSE  I reviewed the triage vital signs and the nursing notes.  Differential diagnosis includes, but is not limited to, electrolyte abnormality, dehydration, acute intracranial process, consideration for CVA but very limited exam and outside the window for intervention, colitis, diverticulitis, other acute intra-abdominal process, anemia  Patient's presentation is most consistent with acute presentation with potential threat to life or bodily function.  77 year old female presenting with progressive weakness and poor p.o. intake.  Hypotensive on presentation and 80/45.  Blood pressure did improve with resuscitation.  Labs notable for significant AKI with creatinine of 3.42, most recent last in 2022 at which time patient had a creatinine of 0.94.  Suspect likely AKI in the setting of poor p.o. intake and diarrhea.  CT head and abdomen pelvis reassuring.  Given degree of AKI, do think patient is appropriate for admission.  Discussed with daughter.  She is agreeable with admission for further evaluation.  Will reach out to hospitalist team.  Reviewed with hospitalist team.  They will evaluate the patient for anticipated admission.      FINAL CLINICAL IMPRESSION(S) / ED DIAGNOSES   Final diagnoses:  AKI (acute kidney injury) (HCC)  Diarrhea, unspecified type     Rx / DC Orders   ED Discharge Orders     None        Note:  This document was prepared using Dragon voice recognition software and may include unintentional dictation errors.   Trinna Post, MD 04/22/23 [phone]

## 2023-04-21 NOTE — ED Triage Notes (Signed)
Pt to ED accompanied with daughter who is pt primary caregiver c/o failure to thrive. Pt daughter reports over the past month pt has been increasingly weak and little PO intake. Reports this all begun after dx with COVID a month ago. Pt daughter reports slower gait  and foot dragging x 1 week. Pt daughter reports pt orientation is at baseline, hx altizmers. Pt is hypotensive in triage.

## 2023-04-22 DIAGNOSIS — R159 Full incontinence of feces: Secondary | ICD-10-CM | POA: Diagnosis present

## 2023-04-22 DIAGNOSIS — I9589 Other hypotension: Secondary | ICD-10-CM | POA: Diagnosis present

## 2023-04-22 DIAGNOSIS — Z79899 Other long term (current) drug therapy: Secondary | ICD-10-CM | POA: Diagnosis not present

## 2023-04-22 DIAGNOSIS — Z8616 Personal history of COVID-19: Secondary | ICD-10-CM | POA: Diagnosis not present

## 2023-04-22 DIAGNOSIS — I129 Hypertensive chronic kidney disease with stage 1 through stage 4 chronic kidney disease, or unspecified chronic kidney disease: Secondary | ICD-10-CM | POA: Diagnosis present

## 2023-04-22 DIAGNOSIS — Z7982 Long term (current) use of aspirin: Secondary | ICD-10-CM | POA: Diagnosis not present

## 2023-04-22 DIAGNOSIS — F0390 Unspecified dementia without behavioral disturbance: Secondary | ICD-10-CM | POA: Diagnosis present

## 2023-04-22 DIAGNOSIS — Z7189 Other specified counseling: Secondary | ICD-10-CM | POA: Diagnosis not present

## 2023-04-22 DIAGNOSIS — Z7984 Long term (current) use of oral hypoglycemic drugs: Secondary | ICD-10-CM | POA: Diagnosis not present

## 2023-04-22 DIAGNOSIS — N1831 Chronic kidney disease, stage 3a: Secondary | ICD-10-CM | POA: Diagnosis present

## 2023-04-22 DIAGNOSIS — R197 Diarrhea, unspecified: Secondary | ICD-10-CM | POA: Diagnosis present

## 2023-04-22 DIAGNOSIS — E785 Hyperlipidemia, unspecified: Secondary | ICD-10-CM | POA: Diagnosis present

## 2023-04-22 DIAGNOSIS — N179 Acute kidney failure, unspecified: Principal | ICD-10-CM | POA: Diagnosis present

## 2023-04-22 DIAGNOSIS — R627 Adult failure to thrive: Secondary | ICD-10-CM | POA: Diagnosis present

## 2023-04-22 DIAGNOSIS — Z888 Allergy status to other drugs, medicaments and biological substances status: Secondary | ICD-10-CM | POA: Diagnosis not present

## 2023-04-22 DIAGNOSIS — Z90710 Acquired absence of both cervix and uterus: Secondary | ICD-10-CM | POA: Diagnosis not present

## 2023-04-22 DIAGNOSIS — E1122 Type 2 diabetes mellitus with diabetic chronic kidney disease: Secondary | ICD-10-CM | POA: Diagnosis present

## 2023-04-22 DIAGNOSIS — Z881 Allergy status to other antibiotic agents status: Secondary | ICD-10-CM | POA: Diagnosis not present

## 2023-04-22 DIAGNOSIS — E860 Dehydration: Secondary | ICD-10-CM | POA: Diagnosis present

## 2023-04-22 LAB — CBC
HCT: 26.9 % — ABNORMAL LOW (ref 36.0–46.0)
Hemoglobin: 8.7 g/dL — ABNORMAL LOW (ref 12.0–15.0)
MCH: 30 pg (ref 26.0–34.0)
MCHC: 32.3 g/dL (ref 30.0–36.0)
MCV: 92.8 fL (ref 80.0–100.0)
Platelets: 168 10*3/uL (ref 150–400)
RBC: 2.9 MIL/uL — ABNORMAL LOW (ref 3.87–5.11)
RDW: 13.2 % (ref 11.5–15.5)
WBC: 3.6 10*3/uL — ABNORMAL LOW (ref 4.0–10.5)
nRBC: 0 % (ref 0.0–0.2)

## 2023-04-22 LAB — BASIC METABOLIC PANEL
Anion gap: 8 (ref 5–15)
BUN: 47 mg/dL — ABNORMAL HIGH (ref 8–23)
CO2: 23 mmol/L (ref 22–32)
Calcium: 8.5 mg/dL — ABNORMAL LOW (ref 8.9–10.3)
Chloride: 111 mmol/L (ref 98–111)
Creatinine, Ser: 1.79 mg/dL — ABNORMAL HIGH (ref 0.44–1.00)
GFR, Estimated: 28 mL/min — ABNORMAL LOW (ref >60)
Glucose, Bld: 104 mg/dL — ABNORMAL HIGH (ref 70–99)
Potassium: 4.3 mmol/L (ref 3.5–5.1)
Sodium: 142 mmol/L (ref 135–145)

## 2023-04-22 LAB — GLUCOSE, CAPILLARY
Glucose-Capillary: 80 mg/dL (ref 70–99)
Glucose-Capillary: 89 mg/dL (ref 70–99)
Glucose-Capillary: 103 mg/dL — ABNORMAL HIGH (ref 70–99)

## 2023-04-22 MED ORDER — MIRTAZAPINE 15 MG PO TABS
7.5000 mg | ORAL_TABLET | Freq: Every day | ORAL | Status: DC
  Administered 2023-04-22: 7.5 mg via ORAL
  Filled 2023-04-22: qty 1

## 2023-04-22 MED ORDER — HALOPERIDOL LACTATE 5 MG/ML IJ SOLN: 1.0000 mg | Freq: Four times a day (QID) | INTRAMUSCULAR | Status: DC | PRN

## 2023-04-22 NOTE — Hospital Course (Signed)
Destiny Vazquez is a 77 y.o. female with medical history significant of dementia, hypertension, hyperlipidemia, diabetes mellitus, CKD-3, recent COVID infection, who presented to [facility] ED on 04/21/2023 for evaluation of progressive generalized weakness and failure to thrive in the setting of Covid-19 infection one month ago.  Since her illness, has had poor appetite, decreased PO intake, poor balance and slowed gait. Also having diarrhea 2-3 times daily without other abdominal complaints.   Patient was found to have significant AKI with Cr. 3.42. Hypotensive in the ED at 80/45 with improved to 100/53 with 1 L fluid bolus.  Patient was admitted for further evaluation and management.  Further hospital course and management as outlined below.

## 2023-04-22 NOTE — Assessment & Plan Note (Addendum)
Recent A1c 5.1, well-controlled.  Patient taking metformin - resume at d/c Covered with sliding scale Novolog during admission.

## 2023-04-22 NOTE — Progress Notes (Signed)
Admission profile completed with pt daughter d/t pt dementia/ poor historian.

## 2023-04-22 NOTE — Assessment & Plan Note (Addendum)
BP 80/45 on arrival to ED. Improved with IV fluids to 107/58. --Encourage PO hydration.  Further IV fluids held due to critical IV fluid shortage.  Pt's BP remained stable 135/60 yesterday PM, 124/74 this AM.  --Maintain MAP > 65 --Hold antihypertensives at discharge -- amlodipine, spironolactone, lisinopril --Home BP monitoring and close PCP follow up recommended

## 2023-04-22 NOTE — Assessment & Plan Note (Addendum)
Trial of low dose mirtazapine started after discussion with family, agreeable. They report pt has very poor PO intake for quite some time, wt loss. Palliative care consulted Outpatient palliative care to follow. Follow up on efficacy and tolerance for mirtazapine, adjust dose accordingly.

## 2023-04-22 NOTE — ED Notes (Signed)
Pt is sleeping, call bell within reach. Daughter @ bedside states no needs at this time

## 2023-04-22 NOTE — ED Notes (Signed)
Requested, via secure chat with Claretta Fraise, that lab make the re-collect of a lt green tube. Awaiting lab

## 2023-04-22 NOTE — Evaluation (Signed)
Physical Therapy Evaluation Patient Details Name: Destiny Vazquez MRN: [id number] DOB: 11-Dec-1945 Today's Date: 04/22/2023  History of Present Illness  Destiny Vazquez is a 77 y.o. female with medical history significant of dementia, hypertension, hyperlipidemia, diabetes mellitus, CKD-3, recent COVID infection, who presents with weakness and failure to thrive.Patient has history of COVID x1 month ago with condition declining since dx.  Clinical Impression  The pt is presenting this session in great spirits. Her daughter who is also her caregiver was present for today's session. The pt is reported to be demonstrating mobility that is consistent with her functional baseline. It is important to note that the patient is presenting with some balance deficits that increase her risk for falls and potential readmission. In order to address balance deficits PT is recommending HHPT at this time. The pt will continued to be followed while in house until discharge to maintain and progress functional mobility.       If plan is discharge home, recommend the following: A little help with bathing/dressing/bathroom;A little help with walking and/or transfers;Direct supervision/assist for medications management;Direct supervision/assist for financial management;Supervision due to cognitive status   Can travel by private vehicle        Equipment Recommendations    Recommendations for Other Services       Functional Status Assessment Patient has had a recent decline in their functional status and demonstrates the ability to make significant improvements in function in a reasonable and predictable amount of time.     Precautions / Restrictions Precautions Precautions: Fall Restrictions Weight Bearing Restrictions: No      Mobility  Bed Mobility Overal bed mobility: Needs Assistance Bed Mobility: Supine to Sit, Sit to Supine     Supine to sit: Supervision (Verbal cues for completion of task) Sit to supine:  Mod assist (Pt wanting to sit up therefore required increased verbal and tactile cues to initiate transition to supine; Mod A for LE managment back to supine.)        Transfers Overall transfer level: Needs assistance Equipment used: None Transfers: Sit to/from Stand Sit to Stand: Supervision, Contact guard assist                Ambulation/Gait Ambulation/Gait assistance: Contact guard assist Gait Distance (Feet): 20 Feet Assistive device: None Gait Pattern/deviations: Shuffle          Stairs            Wheelchair Mobility     Tilt Bed    Modified Rankin (Stroke Patients Only)       Balance Overall balance assessment: Mild deficits observed, not formally tested                                           Pertinent Vitals/Pain Pain Assessment Pain Assessment: No/denies pain    Home Living Family/patient expects to be discharged to:: Private residence Living Arrangements: Children Available Help at Discharge: Family;Available 24 hours/day Type of Home: House Home Access: Stairs to enter   Entrance Stairs-Number of Steps: 1-threshold   Home Layout: One level        Prior Function Prior Level of Function : Needs assist  Cognitive Assist : Mobility (cognitive);ADLs (cognitive) Mobility (Cognitive): Intermittent cues ADLs (Cognitive): Intermittent cues Physical Assist : ADLs (physical)   ADLs (physical): Bathing;Dressing;IADLs         Extremity/Trunk Assessment   Upper Extremity Assessment Upper Extremity  Assessment: Overall WFL for tasks assessed (Pt demonstrating limited shoulder flexion even with visual cueing.)    Lower Extremity Assessment Lower Extremity Assessment: Overall WFL for tasks assessed       Communication   Communication Communication: No apparent difficulties Cueing Techniques: Verbal cues;Visual cues;Tactile cues  Cognition Arousal: Alert Behavior During Therapy: WFL for tasks  assessed/performed Overall Cognitive Status: History of cognitive impairments - at baseline Area of Impairment: Orientation, Attention, Memory, Problem solving                 Orientation Level: Disoriented to, Place, Time, Situation   Memory: Decreased short-term memory       Problem Solving: Slow processing, Decreased initiation, Requires verbal cues          General Comments      Exercises     Assessment/Plan    PT Assessment Patient needs continued PT services  PT Problem List Decreased balance       PT Treatment Interventions Balance training;Functional mobility training    PT Goals (Current goals can be found in the Care Plan section)  Acute Rehab PT Goals Patient Stated Goal: Go home PT Goal Formulation: With patient Time For Goal Achievement: 04/29/23 Potential to Achieve Goals: Good    Frequency Min 2X/week     Co-evaluation               AM-PAC PT "6 Clicks" Mobility  Outcome Measure Help needed turning from your back to your side while in a flat bed without using bedrails?: None Help needed moving from lying on your back to sitting on the side of a flat bed without using bedrails?: None Help needed moving to and from a bed to a chair (including a wheelchair)?: A Little Help needed standing up from a chair using your arms (e.g., wheelchair or bedside chair)?: A Little Help needed to walk in hospital room?: A Little Help needed climbing 3-5 steps with a railing? : A Little 6 Click Score: 20    End of Session Equipment Utilized During Treatment: Gait belt Activity Tolerance: Patient tolerated treatment well Patient left: in bed;with family/visitor present Nurse Communication: Mobility status;Other (comment) Psychologist, occupational informed that the brief that the patient was wearing at the beginning of the session was removed d/t being soiled. Charge RN informed that the floor does not donn new brief for patient. Patient's daughter was informed.) PT  Visit Diagnosis: Unsteadiness on feet (R26.81)    Time: 4010-2725 PT Time Calculation (min) (ACUTE ONLY): 29 min   Charges:   PT Evaluation $PT Eval Low Complexity: 1 Low PT Treatments $Therapeutic Activity: 8-22 mins PT General Charges $$ ACUTE PT VISIT: 1 Visit         9:36 AM, 04/22/23 Lakelynn Severtson A. Mordecai Maes PT, DPT Physical Therapist - [facility] A Melvena Vink 04/22/2023, 9:33 AM

## 2023-04-22 NOTE — Progress Notes (Signed)
SLP Cancellation Note  Patient Details Name: Destiny Vazquez MRN: [id number] DOB: Dec 08, 1945   Cancelled treatment:       Reason Eval/Treat Not Completed: SLP screened, no needs identified, will sign off (SLP consult received and appreciated. Chart review completed. Consulted with daughter. Daughter stated that pt with dementia and no new changes to cognitive-linguistic ability appreciated.)  Clyde Canterbury, M.S., CCC-SLP Speech-Language Pathologist [facility] [phone] Arnette Felts)  Woodroe Chen 04/22/2023, 10:39 AM

## 2023-04-22 NOTE — Assessment & Plan Note (Addendum)
Potentially chronic based on family reports.  Diarrhea episodes at home typically at night. --C diff not sent - no diarrhea episodes --Low suspicion for C diff given no leukocytosis & diarrhea resolved --Consider Imodium and stool bulking agent

## 2023-04-22 NOTE — Evaluation (Signed)
Occupational Therapy Evaluation Patient Details Name: Destiny Vazquez MRN: [id number] DOB: 10-25-1945 Today's Date: 04/22/2023   History of Present Illness Destiny Vazquez is a 77 y.o. female with medical history significant of dementia, hypertension, hyperlipidemia, diabetes mellitus, CKD-3, recent COVID infection, who presents with weakness and failure to thrive.Patient has history of COVID x1 month ago with condition declining since dx.   Clinical Impression   Pt seen for OT eval this date, daughter present and provides PLOF as pt is unable due to hx of dementia. Pt completes ADLs with cognitive assist from daughter/grandsons, has 24/7 supervision and does not use AD for mobility at baseline. Pt incontinent of bowel/bladder and daughter currently has her on a voiding schedule. Daughter endorses that pt is near her functional baseline with ADLs and mobility.  Pt requires multimodal cuing throughout OT eval for safety and environmental awareness. Requires CGA overall for bed mobility and ambulation t/f bathroom. With MAX cues, pt sits on toilet for urine void although OT provides frequent cuing and redirection to stay on task. TOTAL A for hygiene from sit-stand position, HOH assist for washing hands at sink. Mild balance deficits noted as pt tends to reach for support while walking. OT will continue to follow pt through the duration of hospital stay to work on deficits in strength, balance, and overall ADL performance to reduce the risk of falls and readmission.       If plan is discharge home, recommend the following: A little help with walking and/or transfers;A little help with bathing/dressing/bathroom;Assistance with cooking/housework;Assistance with feeding;Direct supervision/assist for medications management;Direct supervision/assist for financial management;Assist for transportation;Help with stairs or ramp for entrance;Supervision due to cognitive status    Functional Status Assessment  Patient  has had a recent decline in their functional status and demonstrates the ability to make significant improvements in function in a reasonable and predictable amount of time.  Equipment Recommendations  Other (comment) (Grab bars)    Recommendations for Other Services Other (comment)     Precautions / Restrictions Precautions Precautions: Fall Restrictions Weight Bearing Restrictions: No      Mobility Bed Mobility Overal bed mobility: Needs Assistance Bed Mobility: Supine to Sit, Sit to Supine     Supine to sit: Supervision Sit to supine: Min assist   General bed mobility comments: Cues needed    Transfers Overall transfer level: Needs assistance Equipment used: None Transfers: Bed to chair/wheelchair/BSC, Sit to/from Stand Sit to Stand: Contact guard assist     Step pivot transfers: Contact guard assist            Balance Overall balance assessment: Mild deficits observed, not formally tested                                         ADL either performed or assessed with clinical judgement   ADL Overall ADL's : Needs assistance/impaired     Grooming: Wash/dry hands;Cueing for sequencing;Standing;Minimal assistance Grooming Details (indicate cue type and reason): HOH assist to wash hands standing at sink                 Toilet Transfer: Contact guard assist;Cueing for sequencing;Ambulation;Grab bars Toilet Transfer Details (indicate cue type and reason): Cues for location of commode Toileting- Clothing Manipulation and Hygiene: Total assistance;Cueing for sequencing;Sit to/from stand Toileting - Clothing Manipulation Details (indicate cue type and reason): Pt's cognitive deficits limit ability to process and perform  functional tasks. Requires TOTAL A for toileting hygine. Cannot perform with verbal and visual cuing     Functional mobility during ADLs: Contact guard assist General ADL Comments: Per daughter, pt at baseline for ADLs. She  requires significant multimodal cuing for cognitive deficits and hx of dementia at basline. Presents with generalized weakness and mild balance deficits.      Pertinent Vitals/Pain Pain Assessment Pain Assessment: No/denies pain     Extremity/Trunk Assessment Upper Extremity Assessment Upper Extremity Assessment: Generalized weakness;Overall [facility] for tasks assessed   Lower Extremity Assessment Lower Extremity Assessment: Overall WFL for tasks assessed       Communication Communication Communication: No apparent difficulties Cueing Techniques: Verbal cues;Gestural cues;Tactile cues   Cognition Arousal: Alert Behavior During Therapy: WFL for tasks assessed/performed Overall Cognitive Status: History of cognitive impairments - at baseline Area of Impairment: Orientation, Attention, Memory, Problem solving                 Orientation Level: Disoriented to, Place, Time, Situation   Memory: Decreased short-term memory       Problem Solving: Slow processing, Decreased initiation, Requires verbal cues                  Home Living Family/patient expects to be discharged to:: Private residence Living Arrangements: Children Available Help at Discharge: Family;Available 24 hours/day Type of Home: House Home Access: Stairs to enter [employer] of Steps: 1-threshold Entrance Stairs-Rails: Right Home Layout: One level     Bathroom Shower/Tub: Producer, television/film/video: Standard Bathroom Accessibility: No   Home Equipment: BSC/3in1          Prior Functioning/Environment Prior Level of Function : Needs assist  Cognitive Assist : Mobility (cognitive);ADLs (cognitive) Mobility (Cognitive): Intermittent cues ADLs (Cognitive): Intermittent cues Physical Assist : ADLs (physical)   ADLs (physical): Bathing;Dressing;IADLs Mobility Comments: no AD ADLs Comments: 1 recent fall, requires assist with all ADLs, incontinent of bowel/bladder         OT Problem List: Decreased strength;Decreased activity tolerance;Decreased range of motion;Impaired balance (sitting and/or standing);Decreased cognition;Decreased safety awareness;Decreased knowledge of use of DME or AE      OT Treatment/Interventions: Self-care/ADL training;Neuromuscular education;Therapeutic exercise;DME and/or AE instruction;Therapeutic activities;Cognitive remediation/compensation;Patient/family education;Balance training    OT Goals(Current goals can be found in the care plan section) Acute Rehab OT Goals OT Goal Formulation: Patient unable to participate in goal setting (Daughter present) Time For Goal Achievement: 05/06/23 Potential to Achieve Goals: Fair  OT Frequency: Min 1X/week       AM-PAC OT "6 Clicks" Daily Activity     Outcome Measure Help from another person eating meals?: A Little Help from another person taking care of personal grooming?: A Little Help from another person toileting, which includes using toliet, bedpan, or urinal?: A Lot Help from another person bathing (including washing, rinsing, drying)?: A Lot Help from another person to put on and taking off regular upper body clothing?: A Little Help from another person to put on and taking off regular lower body clothing?: A Little 6 Click Score: 16   End of Session Equipment Utilized During Treatment: Gait belt Nurse Communication: Other (comment)  Activity Tolerance: Patient tolerated treatment well Patient left: in bed;with family/visitor present;with call bell/phone within reach;with bed alarm set  OT Visit Diagnosis: Unsteadiness on feet (R26.81);Other abnormalities of gait and mobility (R26.89);Muscle weakness (generalized) (M62.81);Other symptoms and signs involving cognitive function  Time: 4098-1191 OT Time Calculation (min): 21 min Charges:  OT General Charges $OT Visit: 1 Visit OT Evaluation $OT Eval Low Complexity: 1 Low OT Treatments $Self Care/Home Management  : 8-22 mins  [place]anville Whitefield L. Natsha Guidry, OTR/L  04/22/23, 11:51 AM

## 2023-04-22 NOTE — Assessment & Plan Note (Addendum)
Cr 3.42 on admission, up from baseline (0.9 -- 1.2 two yrs ago).  Most likely pre-renal due to poor PO intake. --Improved with hydration --Cr 3.42 >> 1.79 >> 1.35 --Monitor BMP at follow up --Renally dose meds & avoid nephrotoxins

## 2023-04-22 NOTE — Assessment & Plan Note (Signed)
On Lipitor 

## 2023-04-22 NOTE — Assessment & Plan Note (Addendum)
BP meds held on admission due to hypotension.  BP is stable, normal today with home meds held.   --Continue holding amlodipine, lisinopril and spironolactone --Home BP monitoring --Close PCP follow up

## 2023-04-22 NOTE — Progress Notes (Signed)
Progress Note   Patient: Destiny Vazquez NWG:[id number] DOB: 11/09/1945 DOA: 04/21/2023     0 DOS: the patient was seen and examined on 04/22/2023   Brief hospital course: Ayusha Figueras is a 77 y.o. female with medical history significant of dementia, hypertension, hyperlipidemia, diabetes mellitus, CKD-3, recent COVID infection, who presented to [facility] ED on 04/21/2023 for evaluation of progressive generalized weakness and failure to thrive in the setting of Covid-19 infection one month ago.  Since her illness, has had poor appetite, decreased PO intake, poor balance and slowed gait. Also having diarrhea 2-3 times daily without other abdominal complaints.   Patient was found to have significant AKI with Cr. 3.42. Hypotensive in the ED at 80/45 with improved to 100/53 with 1 L fluid bolus.  Patient was admitted for further evaluation and management.  Further hospital course and management as outlined below.   Assessment and Plan: * Acute kidney injury superimposed on stage 3a chronic kidney disease (HCC) Cr 3.42 on admission, up from baseline (0.9 -- 1.2 two yrs ago).  Most likely pre-renal due to poor PO intake. --Improving on IV fluids --Cr 3.42 >> 1.79 --Continue IV fluids --Monitor BMP --Renally dose meds & avoid nephrotoxins  Hypotension BP 80/45 on arrival to ED. Improved with IV fluids to 107/58. --Continue IV fluids --Maintain MAP > 65 --Hold antihypertensives  Failure to thrive in adult Trial of low dose mirtazapine  Family agreeable.  They report pt has very poor PO intake for quite some time, wt loss. Follow up PT/OT recommendations  Diarrhea Potentially chronic based on family reports.  Diarrhea episodes at home typically at night. --C diff pending, not yet collected --Low suspicion for C diff given no leukocytosis --Consider Imodium and stool bulking agent once infection ruled out  Hypertension BP meds held on admission due to hypotension. BP's remain soft. --Continue  holding antihypertensives  HLD (hyperlipidemia) On Lipitor  Type II diabetes mellitus with renal manifestations (HCC) Recent A1c 5.1, well-controlled.  Patient taking metformin --Sliding scale Novolog   Decline in Hemoglobin - likely dilutional as all cell lines down from prior CBC Monitor CBC's.  No evidence of bleeding.     Subjective: Pt seen with family at bedside today.  Lunch tray in front of patient, not much eaten.  She denies getting abdominal pain or N/V with eating.  States she is just not hungry.  Family report weight loss.     Physical Exam: Vitals:   04/22/23 0500 04/22/23 0516 04/22/23 0600 04/22/23 0803  BP: (!) 111/52 (!) 111/52 117/70 (!) 106/59  Pulse: (!) 55 (!) 49 (!) 47 (!) 45  Resp:  16  14  Temp:  (!) 97.4 F (36.3 C)  (!) 97.5 F (36.4 C)  TempSrc:  Oral  Oral  SpO2: 100% 100% 100% 99%  Weight:    76.3 kg  Height:    5\' 1"  (1.549 m)   General exam: awake, alert, no acute distress, confused, briefly tearful HEENT: moist mucus membranes, hearing grossly normal  Respiratory system: CTAB, no wheezes, rales or rhonchi, normal respiratory effort. Cardiovascular system: normal S1/S2, RRR, no JVD, murmurs, rubs, gallops, no pedal edema.   Gastrointestinal system: soft, NT, ND, no HSM felt, +bowel sounds. Central nervous system: A&O x self, hospital. no gross focal neurologic deficits, normal speech Extremities: moves all, no edema, normal tone Skin: dry, intact, normal temperature Psychiatry: depressed mood, congruent affect, abnormal judgement and insight due to dementia  Data Reviewed:  Notable labs -- Cr 3.42 >>  1.79, glucose 104, BUN 47, Ca 8.5, GFR 28,   Hbg 12.2 >> 8.7 (?dilution given all cell lines reduced from prior CBC and no evidence of bleeding)  Family Communication: at bedside on rounds  Disposition: Status is: Observation The patient remains OBS appropriate and will d/c before 2 midnights. Remains on IV fluids until further renal  improvement.   Planned Discharge Destination: Home with Home Health    Time spent: 40 minutes  Author: Pennie Banter, DO 04/22/2023 1:56 PM  For on call review [email or web address].

## 2023-04-23 DIAGNOSIS — N1831 Chronic kidney disease, stage 3a: Secondary | ICD-10-CM | POA: Diagnosis not present

## 2023-04-23 DIAGNOSIS — N179 Acute kidney failure, unspecified: Secondary | ICD-10-CM | POA: Diagnosis not present

## 2023-04-23 DIAGNOSIS — Z7189 Other specified counseling: Secondary | ICD-10-CM

## 2023-04-23 LAB — BASIC METABOLIC PANEL
Anion gap: 11 (ref 5–15)
BUN: 37 mg/dL — ABNORMAL HIGH (ref 8–23)
CO2: 23 mmol/L (ref 22–32)
Calcium: 8.9 mg/dL (ref 8.9–10.3)
Chloride: 107 mmol/L (ref 98–111)
Creatinine, Ser: 1.35 mg/dL — ABNORMAL HIGH (ref 0.44–1.00)
GFR, Estimated: 40 mL/min — ABNORMAL LOW (ref >60)
Glucose, Bld: 92 mg/dL (ref 70–99)
Potassium: 4.1 mmol/L (ref 3.5–5.1)
Sodium: 141 mmol/L (ref 135–145)

## 2023-04-23 LAB — CBC
HCT: 34.6 % — ABNORMAL LOW (ref 36.0–46.0)
Hemoglobin: 11.6 g/dL — ABNORMAL LOW (ref 12.0–15.0)
MCH: 29.7 pg (ref 26.0–34.0)
MCHC: 33.5 g/dL (ref 30.0–36.0)
MCV: 88.5 fL (ref 80.0–100.0)
Platelets: 206 10*3/uL (ref 150–400)
RBC: 3.91 MIL/uL (ref 3.87–5.11)
RDW: 12.9 % (ref 11.5–15.5)
WBC: 4.4 10*3/uL (ref 4.0–10.5)
nRBC: 0 % (ref 0.0–0.2)

## 2023-04-23 LAB — HEMOGLOBIN AND HEMATOCRIT, BLOOD
HCT: 36.2 % (ref 36.0–46.0)
Hemoglobin: 12.2 g/dL (ref 12.0–15.0)

## 2023-04-23 LAB — GLUCOSE, CAPILLARY
Glucose-Capillary: 80 mg/dL (ref 70–99)
Glucose-Capillary: 112 mg/dL — ABNORMAL HIGH (ref 70–99)

## 2023-04-23 LAB — MAGNESIUM: Magnesium: 1.3 mg/dL — ABNORMAL LOW (ref 1.7–2.4)

## 2023-04-23 LAB — PHOSPHORUS: Phosphorus: 2.2 mg/dL — ABNORMAL LOW (ref 2.5–4.6)

## 2023-04-23 MED ORDER — POTASSIUM & SODIUM PHOSPHATES 280-160-250 MG PO PACK
1.0000 | PACK | Freq: Three times a day (TID) | ORAL | Status: DC
  Filled 2023-04-23 (×2): qty 1

## 2023-04-23 MED ORDER — MIRTAZAPINE 7.5 MG PO TABS: 7.5000 mg | ORAL_TABLET | Freq: Every day | ORAL | 2 refills | Status: AC

## 2023-04-23 MED ORDER — MAGNESIUM CHLORIDE 64 MG PO TBEC: DELAYED_RELEASE_TABLET | ORAL | 0 refills | Status: AC

## 2023-04-23 MED ORDER — FUROSEMIDE 20 MG PO TABS: 20.0000 mg | ORAL_TABLET | Freq: Every day | ORAL | Status: AC | PRN

## 2023-04-23 MED ORDER — MAGNESIUM CHLORIDE 64 MG PO TBEC
2.0000 | DELAYED_RELEASE_TABLET | Freq: Two times a day (BID) | ORAL | Status: DC
  Filled 2023-04-23: qty 2

## 2023-04-23 MED ORDER — POTASSIUM & SODIUM PHOSPHATES 280-160-250 MG PO PACK: 1.0000 | PACK | Freq: Three times a day (TID) | ORAL | 0 refills | Status: AC

## 2023-04-23 NOTE — Progress Notes (Signed)
Physical Therapy Treatment Patient Details Name: Destiny Vazquez MRN: [id number] DOB: [birth year] Today's Date: 04/23/2023   History of Present Illness Destiny Vazquez is a 77 y.o. female with medical history significant of dementia, hypertension, hyperlipidemia, diabetes mellitus, CKD-3, recent COVID infection, who presents with weakness and failure to thrive.Patient has history of COVID x1 month ago with condition declining since dx.    PT Comments  Pt received in bed, grandson at bedside. Pt appears to be close to functional baseline per observation by this therapist and grandson. No LOB during various transfers and gait in room without assistive device. Pt has 24/7 Supervision at home with all needs met.    If plan is discharge home, recommend the following: A little help with bathing/dressing/bathroom;A little help with walking and/or transfers;Direct supervision/assist for medications management;Direct supervision/assist for financial management;Supervision due to cognitive status   Can travel by private vehicle        Equipment Recommendations  None recommended by PT    Recommendations for Other Services       Precautions / Restrictions Precautions Precautions: Fall Restrictions Weight Bearing Restrictions: No     Mobility  Bed Mobility Overal bed mobility: Needs Assistance Bed Mobility: Supine to Sit, Sit to Supine     Supine to sit: Supervision     General bed mobility comments:  (Cues only to initiate task)    Transfers Overall transfer level: Needs assistance Equipment used: None Transfers: Sit to/from Stand Sit to Stand: Supervision           General transfer comment:  (Pt able to transfer to/from bed, chair, and toilet)    Ambulation/Gait Ambulation/Gait assistance: Contact guard assist, Supervision Gait Distance (Feet): 75 Feet Assistive device: None Gait Pattern/deviations: Shuffle Gait velocity: decreased     General Gait Details:  (No LOB with  changes in direction and negotiating objects in room)   Stairs             Wheelchair Mobility     Tilt Bed    Modified Rankin (Stroke Patients Only)       Balance Overall balance assessment: Mild deficits observed, not formally tested                                          Cognition Arousal: Alert Behavior During Therapy: WFL for tasks assessed/performed Overall Cognitive Status: History of cognitive impairments - at baseline Area of Impairment: Orientation, Attention, Memory, Problem solving                 Orientation Level: Disoriented to, Place, Time, Situation Current Attention Level: Focused Memory: Decreased short-term memory       Problem Solving: Slow processing, Decreased initiation, Requires verbal cues General Comments: Family present, pt has 24 hour supervision        Exercises      General Comments General comments (skin integrity, edema, etc.):  (Discussed home environment with grandson and set up. Pt's family provides 24/7 assist, all needs appropriately provided)      Pertinent Vitals/Pain Pain Assessment Pain Assessment: No/denies pain    Home Living                          Prior Function            PT Goals (current goals can now be found in the care plan section)  Acute Rehab PT Goals Patient Stated Goal: Go home Progress towards PT goals: Progressing toward goals    Frequency    Min 2X/week      PT Plan      Co-evaluation              AM-PAC PT "6 Clicks" Mobility   Outcome Measure  Help needed turning from your back to your side while in a flat bed without using bedrails?: None Help needed moving from lying on your back to sitting on the side of a flat bed without using bedrails?: None Help needed moving to and from a bed to a chair (including a wheelchair)?: A Little Help needed standing up from a chair using your arms (e.g., wheelchair or bedside chair)?: A  Little Help needed to walk in hospital room?: A Little Help needed climbing 3-5 steps with a railing? : A Little 6 Click Score: 20    End of Session   Activity Tolerance: Patient tolerated treatment well Patient left: in chair;with call bell/phone within reach;with family/visitor present Nurse Communication: Mobility status;Other (comment) PT Visit Diagnosis: Unsteadiness on feet (R26.81)     Time: 0981-1914 PT Time Calculation (min) (ACUTE ONLY): 19 min  Charges:    $Therapeutic Activity: 8-22 mins PT General Charges $$ ACUTE PT VISIT: 1 Visit                    Zadie Cleverly, PTA  Jannet Askew 04/23/2023, 1:49 PM

## 2023-04-23 NOTE — Discharge Summary (Signed)
Physician Discharge Summary   Patient: Destiny Vazquez MRN: [id number] DOB: 1945/11/05  Admit date:     04/21/2023  Discharge date: 04/23/2023  Discharge Physician: Pennie Banter   PCP: [facility]   Recommendations at discharge:    Follow up with Primary Care in 1-2 weeks Repeat BMP, Mg, Phos, CBC at follow up Recommend discussions on code status and goals of care with Palliative, given underlying dementia, poor oral intake leading to AKI and electrolyte derangements Follow up on tolerance & efficacy of mirtazapine for improving appetite Follow up on blood pressures -- meds were held during admission and BP's were well controlled and soft at times.  Resume medications gradually as indicated  Discharge Diagnoses: Principal Problem:   Acute kidney injury superimposed on stage 3a chronic kidney disease (HCC) Active Problems:   Failure to thrive in adult   Diarrhea   HLD (hyperlipidemia)   Hypertension   Type II diabetes mellitus with renal manifestations (HCC)   AKI (acute kidney injury) (HCC)  Resolved Problems:   Hypotension  Hospital Course: Destiny Vazquez is a 77 y.o. female with medical history significant of dementia, hypertension, hyperlipidemia, diabetes mellitus, CKD-3, recent COVID infection, who presented to [facility] ED on 04/21/2023 for evaluation of progressive generalized weakness and failure to thrive in the setting of Covid-19 infection one month ago.  Since her illness, has had poor appetite, decreased PO intake, poor balance and slowed gait. Also having diarrhea 2-3 times daily without other abdominal complaints.   Patient was found to have significant AKI with Cr. 3.42. Hypotensive in the ED at 80/45 with improved to 100/53 with 1 L fluid bolus.  Patient was admitted for further evaluation and management.  Further hospital course and management as outlined below.   Assessment and Plan: * Acute kidney injury superimposed on stage 3a chronic  kidney disease (HCC) Cr 3.42 on admission, up from baseline (0.9 -- 1.2 two yrs ago).  Most likely pre-renal due to poor PO intake. --Improved with hydration --Cr 3.42 >> 1.79 >> 1.35 --Monitor BMP at follow up --Renally dose meds & avoid nephrotoxins   Hypotension-resolved as of 05/02/2023 BP 80/45 on arrival to ED. Improved with IV fluids to 107/58. --Encourage PO hydration.  Further IV fluids held due to critical IV fluid shortage.  Pt's BP remained stable 135/60 yesterday PM, 124/74 this AM.  --Maintain MAP > 65 --Hold antihypertensives at discharge -- amlodipine, spironolactone, lisinopril --Home BP monitoring and close PCP follow up recommended  Failure to thrive in adult Trial of low dose mirtazapine started after discussion with family, agreeable. They report pt has very poor PO intake for quite some time, wt loss. Palliative care consulted Outpatient palliative care to follow. Follow up on efficacy and tolerance for mirtazapine, adjust dose accordingly.  Diarrhea Potentially chronic based on family reports.  Diarrhea episodes at home typically at night. --C diff not sent - no diarrhea episodes --Low suspicion for C diff given no leukocytosis & diarrhea resolved --Consider Imodium and stool bulking agent   Hypertension BP meds held on admission due to hypotension.  BP is stable, normal today with home meds held.   --Continue holding amlodipine, lisinopril and spironolactone --Home BP monitoring --Close PCP follow up  HLD (hyperlipidemia) On Lipitor  Type II diabetes mellitus with renal manifestations (HCC) Recent A1c 5.1, well-controlled.  Patient taking metformin - resume at d/c Covered with sliding scale Novolog during admission.  AKI (acute kidney injury) (HCC) Improved Cr to 1.35, down  from 3.42 on admission after fluids given in ED and on admission.    --Encourage PO hydration --Repeat BMP at follow up         Consultants: None Procedures performed:  none  Disposition: Home health Diet recommendation:  Discharge Diet Orders (From admission, onward)     Start     Ordered   04/23/23 0000  Diet - low sodium heart healthy        04/23/23 1253           Regular diet DISCHARGE MEDICATION: Allergies as of 04/23/2023       Reactions   Ciprofloxacin    Other reaction(s): Unknown   Nitrofurantoin    Other reaction(s): Unknown   Other    Pt states allergic to two medications but does not know name.        Medication List     STOP taking these medications    amLODipine 10 MG tablet Commonly known as: NORVASC   aspirin EC 81 MG tablet   lisinopril 40 MG tablet Commonly known as: ZESTRIL   spironolactone 25 MG tablet Commonly known as: ALDACTONE       TAKE these medications    atorvastatin 20 MG tablet Commonly known as: LIPITOR Take 20 mg by mouth daily.   blood glucose meter kit and supplies Kit Dispense based on patient and insurance preference. Use up to four times daily as directed.   cyanocobalamin 1000 MCG tablet Commonly known as: VITAMIN B12 Take 1,000 mcg by mouth every other day.   donepezil 10 MG tablet Commonly known as: ARICEPT Take 10 mg by mouth daily.   furosemide 20 MG tablet Commonly known as: LASIX Take 1 tablet (20 mg total) by mouth daily as needed (swelling or weight gain). What changed:  when to take this reasons to take this   gabapentin 300 MG capsule Commonly known as: NEURONTIN Take 600 mg by mouth 3 (three) times daily.   GNP Co Q10 200 MG capsule Generic drug: Coenzyme Q10 Take 200 mg by mouth daily.   magnesium chloride 64 MG Tbec SR tablet Commonly known as: SLOW-MAG Take 2 tablets (128 mg total) by mouth 2 (two) times daily for 3 days, THEN 1 tablet (64 mg total) daily for 27 days. Start taking on: April 23, 2023   metFORMIN 1000 MG tablet Commonly known as: GLUCOPHAGE Take 1,000 mg by mouth 2 (two) times daily.   mirtazapine 7.5 MG tablet Commonly known  as: REMERON Take 1 tablet (7.5 mg total) by mouth at bedtime.   Mucinex 600 MG 12 hr tablet Generic drug: guaiFENesin Take 600 mg by mouth every 12 (twelve) hours as needed.   omeprazole 40 MG capsule Commonly known as: PRILOSEC Take 40 mg by mouth daily.   Vitamin D (Ergocalciferol) 1.25 MG (50000 UNIT) Caps capsule Commonly known as: DRISDOL Take 50,000 Units by mouth as directed. 1 capsule Orally once every other week for 30 day(s)       ASK your doctor about these medications    potassium & sodium phosphates 280-160-250 MG Pack Commonly known as: PHOS-NAK Take 1 packet by mouth 4 (four) times daily -  with meals and at bedtime for 3 days. Ask about: Should I take this medication?        Discharge Exam: Filed Weights   04/21/23 1458 04/22/23 0803  Weight: 80.7 kg 76.3 kg   General exam: awake, alert, no acute distress, confused HEENT: moist mucus membranes, hearing grossly normal  Respiratory system: CTAB, no wheezes, rales or rhonchi, normal respiratory effort. Cardiovascular system: normal S1/S2,  RRR, no JVD, murmurs, rubs, gallops, no pedal edema.   Gastrointestinal system: soft, NT, ND, no HSM felt, +bowel sounds. Central nervous system: A&O x self and hospital. no gross focal neurologic deficits, normal speech Extremities: moves all, no edema, normal tone Skin: dry, intact, normal temperature Psychiatry: normal mood, congruent affect, abnormal judgement and insight due to dementia   Condition at discharge: stable  The results of significant diagnostics from this hospitalization (including imaging, microbiology, ancillary and laboratory) are listed below for reference.   Imaging Studies: CT Head Wo Contrast  Result Date: 04/21/2023 CLINICAL DATA:  Altered mental status EXAM: CT HEAD WITHOUT CONTRAST TECHNIQUE: Contiguous axial images were obtained from the base of the skull through the vertex without intravenous contrast. RADIATION DOSE REDUCTION: This exam  was performed according to the departmental dose-optimization program which includes automated exposure control, adjustment of the mA and/or kV according to patient size and/or use of iterative reconstruction technique. COMPARISON:  None Available. FINDINGS: Cardiovascular: No significant vascular findings. Normal heart size. No pericardial effusion. Periventricular white matter hypodensity. Mediastinum/Nodes: No enlarged mediastinal, hilar, or axillary lymph nodes. Thyroid gland, trachea, and esophagus demonstrate no significant findings. Lungs/Pleura: Lungs are clear. No pleural effusion or pneumothorax. Upper Abdomen: No acute abnormality. Musculoskeletal: No chest wall abnormality. No acute osseous findings. IMPRESSION: No acute intracranial pathology.  Small-vessel white matter disease. Electronically Signed   By: Jearld Lesch M.D.   On: 04/21/2023 17:58   CT ABDOMEN PELVIS WO CONTRAST  Result Date: 04/21/2023 CLINICAL DATA:  Acute abdominal pain.  Failure to thrive EXAM: CT ABDOMEN AND PELVIS WITHOUT CONTRAST TECHNIQUE: Multidetector CT imaging of the abdomen and pelvis was performed following the standard protocol without IV contrast. RADIATION DOSE REDUCTION: This exam was performed according to the departmental dose-optimization program which includes automated exposure control, adjustment of the mA and/or kV according to patient size and/or use of iterative reconstruction technique. COMPARISON:  None Available. FINDINGS: Lower chest: There are some linear opacity lung bases likely scar or atelectasis. No pleural effusion. Slight areas of bronchial wall thickening. Mild motion. Coronary artery calcifications are seen. Hepatobiliary: On this non IV contrast exam, grossly preserved hepatic parenchyma. Previous cholecystectomy. Pancreas: Mild pancreatic atrophy.  No obvious mass. Spleen: Normal in size without focal abnormality. Adrenals/Urinary Tract: Slight thickening of the adrenal glands bilaterally,  nonspecific. No renal or ureteral stones are identified. No collecting system dilatation. The ureters have normal course and caliber extending down to the bladder. Preserved contours of the underdistended urinary bladder. Stomach/Bowel: There is fluid and debris in the stomach which is underdistended. On this non oral contrast exam, the small bowel is nondilated. Large bowel has a normal course and caliber. Scattered colonic stool. Few colonic diverticula. Normal appendix extends medial to the cecum in the right lower quadrant. Vascular/Lymphatic: Diffuse scattered vascular calcifications. Normal caliber aorta and IVC. No specific abnormal lymph node enlargement identified in the abdomen and pelvis. Reproductive: Status post hysterectomy. No adnexal masses. Other: Mild anasarca.  No free intra-air or free fluid. Musculoskeletal: Scattered degenerative changes of the spine and pelvis. There is Schmorl's node deformity along the superior endplate of T12. Prominent bridging osteophytes elsewhere along the visualized portions of the lower thoracic spine. IMPRESSION: No bowel obstruction, free air or free fluid. Normal appendix. Few left-sided colonic diverticula. No obstructing renal stone or ureteral stones. Electronically Signed   By: Piedad Climes.D.  On: 04/21/2023 17:55    Microbiology: Results for orders placed or performed during the hospital encounter of 03/23/21  Resp Panel by RT-PCR (Flu A&B, Covid) Nasopharyngeal Swab     Status: None   Collection Time: 03/23/21  1:57 AM   Specimen: Nasopharyngeal Swab; Nasopharyngeal(NP) swabs in vial transport medium  Result Value Ref Range Status   SARS Coronavirus 2 by RT PCR NEGATIVE NEGATIVE Final    Comment: (NOTE) SARS-CoV-2 target nucleic acids are NOT DETECTED.  The SARS-CoV-2 RNA is generally detectable in upper respiratory specimens during the acute phase of infection. The lowest concentration of SARS-CoV-2 viral copies this assay can detect  is 138 copies/mL. A negative result does not preclude SARS-Cov-2 infection and should not be used as the sole basis for treatment or other patient management decisions. A negative result may occur with  improper specimen collection/handling, submission of specimen other than nasopharyngeal swab, presence of viral mutation(s) within the areas targeted by this assay, and inadequate number of viral copies(<138 copies/mL). A negative result must be combined with clinical observations, patient history, and epidemiological information. The expected result is Negative.  Fact Sheet for Patients:  BloggerCourse.com  Fact Sheet for Healthcare Providers:  SeriousBroker.it  This test is no t yet approved or cleared by the [place] FDA and  has been authorized for detection and/or diagnosis of SARS-CoV-2 by FDA under an Emergency Use Authorization (EUA). This EUA will remain  in effect (meaning this test can be used) for the duration of the COVID-19 declaration under Section 564(b)(1) of the Act, 21 U.S.C.section 360bbb-3(b)(1), unless the authorization is terminated  or revoked sooner.       Influenza A by PCR NEGATIVE NEGATIVE Final   Influenza B by PCR NEGATIVE NEGATIVE Final    Comment: (NOTE) The Xpert Xpress SARS-CoV-2/FLU/RSV plus assay is intended as an aid in the diagnosis of influenza from Nasopharyngeal swab specimens and should not be used as a sole basis for treatment. Nasal washings and aspirates are unacceptable for Xpert Xpress SARS-CoV-2/FLU/RSV testing.  Fact Sheet for Patients: BloggerCourse.com  Fact Sheet for Healthcare Providers: SeriousBroker.it  This test is not yet approved or cleared by the [place] FDA and has been authorized for detection and/or diagnosis of SARS-CoV-2 by FDA under an Emergency Use Authorization (EUA). This EUA will remain in effect  (meaning this test can be used) for the duration of the COVID-19 declaration under Section 564(b)(1) of the Act, 21 U.S.C. section 360bbb-3(b)(1), unless the authorization is terminated or revoked.  Performed at [facility], [address] [place], [place] [address]   Urine Culture     Status: None   Collection Time: 03/23/21  2:42 AM   Specimen: Urine, Clean Catch  Result Value Ref Range Status   Specimen Description   Final    URINE, CLEAN CATCH Performed at [facility], [address], [place], [place] [address]    Special Requests   Final    NONE Performed at [facility], [address], [place], [place] [address]    Culture   Final    NO GROWTH Performed at [facility], [address] [place], [place] [address]    Report Status 03/24/2021 FINAL  Final  Culture, blood (x 2)     Status: None   Collection Time: 03/23/21  6:48 AM   Specimen: BLOOD LEFT HAND  Result Value Ref Range Status   Specimen Description BLOOD LEFT HAND  Final   Special  Requests   Final    BOTTLES DRAWN AEROBIC AND ANAEROBIC Blood Culture adequate volume   Culture   Final    NO GROWTH 5 DAYS Performed at [facility], [address] [place], [place] [address]    Report Status 03/28/2021 FINAL  Final  Culture, blood (x 2)     Status: None   Collection Time: 03/23/21  6:59 AM   Specimen: BLOOD RIGHT HAND  Result Value Ref Range Status   Specimen Description BLOOD RIGHT HAND  Final   Special Requests   Final    BOTTLES DRAWN AEROBIC AND ANAEROBIC Blood Culture adequate volume   Culture   Final    NO GROWTH 5 DAYS Performed at [facility], [address] [place], [place] [address]    Report Status 03/28/2021 FINAL  Final    Labs: CBC: No results for input(s): "WBC", "NEUTROABS", "HGB", "HCT", "MCV", "PLT" in the last 168 hours.  Basic Metabolic Panel: No results for input(s): "NA", "K", "CL", "CO2", "GLUCOSE", "BUN",  "CREATININE", "CALCIUM", "MG", "PHOS" in the last 168 hours.  Liver Function Tests: No results for input(s): "AST", "ALT", "ALKPHOS", "BILITOT", "PROT", "ALBUMIN" in the last 168 hours.  CBG: No results for input(s): "GLUCAP" in the last 168 hours.   Discharge time spent: greater than 30 minutes.  Signed: Pennie Banter, DO [facility] 05/02/2023

## 2023-04-23 NOTE — Consult Note (Signed)
Consultation Note Date: 04/23/2023   Patient Name: Destiny Vazquez  DOB: Dec 14, 1945  MRN: [id number]  Age / Sex: 77 y.o., female  PCP: [facility] Referring Physician: Pennie Banter, DO  Reason for Consultation: Establishing goals of care  HPI/Patient Profile: Destiny Vazquez is a 77 y.o. female with medical history significant of dementia, hypertension, hyperlipidemia, diabetes mellitus, CKD-3, recent COVID infection, who presented to [facility] ED on 04/21/2023 for evaluation of progressive generalized weakness and failure to thrive in the setting of Covid-19 infection one month ago.  Since her illness, has had poor appetite, decreased PO intake, poor balance and slowed gait. Also having diarrhea 2-3 times daily without other abdominal complaints.   Clinical Assessment and Goals of Care: Notes to see patient.  Patient resting in bedside.  Patient looks around but does not speak during time at bedside.  Lucila Maine states patient is divorced.  He states she has 3 children, and lives with her daughter.  States at baseline she has a slow steady gait with walking.  He states for the past few weeks she has not been eating well, nibbling on food throughout the day. PT entered to work with patient, and I stepped out.  Will need to speak with patient's children.   Prior to calling patient's daughter, attending team states patient can be discharged home.  Discussed outpatient palliative    SUMMARY OF RECOMMENDATIONS    Recommend outpatient palliative to follow.  Prognosis:  Poor overall      Primary Diagnoses: Present on Admission:  Acute kidney injury superimposed on stage 3a chronic kidney disease (HCC)  HLD (hyperlipidemia)  Hypertension  Hypotension  Failure to thrive in adult  Diarrhea  Type II diabetes mellitus with renal manifestations (HCC)  AKI (acute kidney injury) (HCC)   I  have reviewed the medical record, interviewed the patient and family, and examined the patient. The following aspects are pertinent.  Past Medical History:  Diagnosis Date   Chronic renal failure, stage 3a (HCC)    Dementia (HCC)    Diabetes mellitus without complication (HCC)    HLD (hyperlipidemia)    Hypertension    Renal disorder    Social History   Socioeconomic History   Marital status: Divorced    Spouse name: Not on file   Number of children: Not on file   Years of education: Not on file   Highest education level: Not on file  Occupational History   Not on file  Tobacco Use   Smoking status: Never   Smokeless tobacco: Never  Substance and Sexual Activity   Alcohol use: No   Drug use: No   Sexual activity: Not on file  Other Topics Concern   Not on file  Social History Narrative   Not on file   Social Determinants of Health   Financial Resource Strain: Not on file  Food Insecurity: No Food Insecurity (04/22/2023)   Hunger Vital Sign    Worried About Running Out of Food in the Last Year:  Never true    Ran Out of Food in the Last Year: Never true  Transportation Needs: No Transportation Needs (04/22/2023)   PRAPARE - Administrator, Civil Service (Medical): No    Lack of Transportation (Non-Medical): No  Physical Activity: Not on file  Stress: Not on file  Social Connections: Unknown (11/27/2021)   [facility] from [facility]   Social Network    Social Network: Not on file   No family history on file. Scheduled Meds:  atorvastatin  20 mg Oral Daily   heparin  5,000 Units Subcutaneous Q8H   insulin aspart  0-5 Units Subcutaneous QHS   insulin aspart  0-9 Units Subcutaneous TID WC   magnesium chloride  2 tablet Oral BID   mirtazapine  7.5 mg Oral QHS   pantoprazole  40 mg Oral Daily   potassium & sodium phosphates  1 packet Oral TID WC & HS   Continuous Infusions: PRN Meds:.acetaminophen, haloperidol lactate, hydrALAZINE,  ondansetron (ZOFRAN) IV Medications Prior to Admission:  Prior to Admission medications   Medication Sig Start Date End Date Taking? Authorizing Provider  amLODipine (NORVASC) 10 MG tablet Take 10 mg by mouth daily. 02/22/21  Yes [provider]  atorvastatin (LIPITOR) 20 MG tablet Take 20 mg by mouth daily. 02/22/21  Yes [provider]  donepezil (ARICEPT) 10 MG tablet Take 10 mg by mouth daily. 02/22/21  Yes [provider]  gabapentin (NEURONTIN) 300 MG capsule Take 600 mg by mouth 3 (three) times daily.   Yes [provider]  GNP CO Q10 200 MG capsule Take 200 mg by mouth daily. 02/22/21  Yes [provider]  guaiFENesin (MUCINEX) 600 MG 12 hr tablet Take 600 mg by mouth every 12 (twelve) hours as needed. 04/05/23  Yes [provider]  lisinopril (PRINIVIL,ZESTRIL) 40 MG tablet Take 40 mg by mouth daily.   Yes [provider]  metFORMIN (GLUCOPHAGE) 1000 MG tablet Take 1,000 mg by mouth 2 (two) times daily.   Yes [provider]  omeprazole (PRILOSEC) 40 MG capsule Take 40 mg by mouth daily.   Yes [provider]  spironolactone (ALDACTONE) 25 MG tablet Take 25 mg by mouth 2 (two) times daily.   Yes [provider]  vitamin B-12 (CYANOCOBALAMIN) 1000 MCG tablet Take 1,000 mcg by mouth every other day. 02/22/21  Yes [provider]  Vitamin D, Ergocalciferol, (DRISDOL) 50000 units CAPS capsule Take 50,000 Units by mouth as directed. 1 capsule Orally once every other week for 30 day(s)   Yes [provider]  aspirin EC 81 MG tablet Take 81 mg by mouth daily. Patient not taking: Reported on 04/21/2023    [provider]  blood glucose meter kit and supplies KIT Dispense based on patient and insurance preference. Use up to four times daily as directed. 03/24/21   Burnadette Pop, MD  furosemide (LASIX) 20 MG tablet Take 1 tablet (20 mg total) by mouth daily as needed (swelling or weight gain).  04/23/23   Pennie Banter, DO  magnesium chloride (SLOW-MAG) 64 MG TBEC SR tablet Take 2 tablets (128 mg total) by mouth 2 (two) times daily for 3 days, THEN 1 tablet (64 mg total) daily for 27 days. 04/23/23 05/23/23  Esaw Grandchild A, DO  mirtazapine (REMERON) 7.5 MG tablet Take 1 tablet (7.5 mg total) by mouth at bedtime. 04/23/23   Esaw Grandchild A, DO  potassium & sodium phosphates (PHOS-NAK) 280-160-250 MG PACK Take  1 packet by mouth 4 (four) times daily -  with meals and at bedtime for 3 days. 04/23/23 04/26/23  Esaw Grandchild A, DO   Allergies  Allergen Reactions   Ciprofloxacin     Other reaction(s): Unknown   Nitrofurantoin     Other reaction(s): Unknown   Other     Pt states allergic to two medications but does not know name.   Review of Systems  Unable to perform ROS   Physical Exam Constitutional:      Comments: Eyes open  Pulmonary:     Effort: Pulmonary effort is normal.     Vital Signs: BP 124/74 (BP Location: Right Arm)   Pulse (!) 58   Temp 98 F (36.7 C)   Resp 18   Ht 5\' 1"  (1.549 m)   Wt 76.3 kg   SpO2 100%   BMI 31.78 kg/m  Pain Scale: 0-10   Pain Score: 0-No pain   SpO2: SpO2: 100 % O2 Device:SpO2: 100 % O2 Flow Rate: .   IO: Intake/output summary:  Intake/Output Summary (Last 24 hours) at 04/23/2023 1357 Last data filed at 04/22/2023 2000 Gross per 24 hour  Intake 1796.25 ml  Output --  Net 1796.25 ml    LBM: Last BM Date :  (PTA) Baseline Weight: Weight: 80.7 kg Most recent weight: Weight: 76.3 kg       Signed by: Morton Stall, NP   Please contact Palliative Medicine Team phone at [phone] for questions and concerns.  For individual provider: See Loretha Stapler

## 2023-05-02 ENCOUNTER — Encounter: Payer: Self-pay | Admitting: Internal Medicine

## 2023-05-02 NOTE — Assessment & Plan Note (Signed)
Improved Cr to 1.35, down from 3.42 on admission after fluids given in ED and on admission.    --Encourage PO hydration --Repeat BMP at follow up
